# Patient Record
Sex: Female | Born: 1995 | Race: White | Hispanic: No | Marital: Single | Smoking: Current every day smoker
Health system: Southern US, Community
[De-identification: ages and names within clinical notes are randomized; demographics above are authoritative.]

## PROBLEM LIST (undated history)

## (undated) ENCOUNTER — Inpatient Hospital Stay: Payer: Self-pay

## (undated) DIAGNOSIS — I10 Essential (primary) hypertension: Secondary | ICD-10-CM

## (undated) DIAGNOSIS — Z789 Other specified health status: Secondary | ICD-10-CM

## (undated) DIAGNOSIS — G43909 Migraine, unspecified, not intractable, without status migrainosus: Secondary | ICD-10-CM

## (undated) DIAGNOSIS — F99 Mental disorder, not otherwise specified: Secondary | ICD-10-CM

## (undated) HISTORY — DX: Essential (primary) hypertension: I10

## (undated) HISTORY — PX: NO PAST SURGERIES: SHX2092

## (undated) HISTORY — PX: OTHER SURGICAL HISTORY: SHX169

## (undated) HISTORY — DX: Migraine, unspecified, not intractable, without status migrainosus: G43.909

## (undated) HISTORY — DX: Mental disorder, not otherwise specified: F99

---
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
## 2016-06-01 LAB — OB RESULTS CONSOLE GC/CHLAMYDIA
CHLAMYDIA, DNA PROBE: POSITIVE
GC PROBE AMP, GENITAL: NEGATIVE

## 2016-06-19 NOTE — L&D Delivery Note (Signed)
Delivery Note At 5:03 AM a viable female infant was delivered via Vaginal, Spontaneous Delivery (Presentation: OA).  APGAR: 8, 8; weight 7 lb 9.3 oz (3440 g).   Placenta status: delivered spontaneously, intact.  Cord: 3VC with the following complications: none.  Cord pH: n/a  Anesthesia: epidural  Episiotomy: None Lacerations: None Suture Repair: n/a Est. Blood Loss (mL): 700  Mom to postpartum.  Baby to Couplet care / Skin to Skin.  Called to see patient.  Mom pushed to deliver a viable female infant.  The head followed by shoulders, which delivered without difficulty, and the rest of the body.  No nuchal cord noted.  Baby to mom's chest.  Cord clamped and cut after > 1 min delay.  Cord blood obtained.  Placenta delivered spontaneously, intact, with a 3-vessel cord.  No vaginal, cervical, or perineal lacerations.  She had a postpartum hemorrhage that required pitocin IV, misoprostol 800 mcg PR, and Hemabate 250 mcg IM.  All counts correct.  Hemostasis obtained with IV pitocin and fundal massage. EBL 700 mL.     Dawn MohairStephen Leani Chambers 09/06/2016, 5:37 AM

## 2016-06-23 LAB — OB RESULTS CONSOLE ANTIBODY SCREEN: Antibody Screen: NEGATIVE

## 2016-06-23 LAB — OB RESULTS CONSOLE VARICELLA ZOSTER ANTIBODY, IGG: Varicella: IMMUNE

## 2016-06-23 LAB — OB RESULTS CONSOLE ABO/RH: RH TYPE: POSITIVE

## 2016-06-23 LAB — OB RESULTS CONSOLE PLATELET COUNT: Platelets: 173 10*3/uL

## 2016-06-23 LAB — OB RESULTS CONSOLE RPR: RPR: NONREACTIVE

## 2016-06-23 LAB — OB RESULTS CONSOLE HGB/HCT, BLOOD
HEMATOCRIT: 35 %
Hemoglobin: 11.6 g/dL

## 2016-06-23 LAB — OB RESULTS CONSOLE HEPATITIS B SURFACE ANTIGEN: HEP B S AG: NEGATIVE

## 2016-06-23 LAB — OB RESULTS CONSOLE RUBELLA ANTIBODY, IGM: RUBELLA: IMMUNE

## 2016-06-23 LAB — OB RESULTS CONSOLE GC/CHLAMYDIA
CHLAMYDIA, DNA PROBE: NEGATIVE
GC PROBE AMP, GENITAL: NEGATIVE

## 2016-06-23 LAB — OB RESULTS CONSOLE HIV ANTIBODY (ROUTINE TESTING): HIV: NONREACTIVE

## 2016-08-23 ENCOUNTER — Encounter: Payer: Self-pay | Admitting: Obstetrics and Gynecology

## 2016-08-23 LAB — OB RESULTS CONSOLE GBS: STREP GROUP B AG: NEGATIVE

## 2016-08-30 ENCOUNTER — Encounter: Payer: Medicaid Other | Admitting: Obstetrics & Gynecology

## 2016-09-05 ENCOUNTER — Inpatient Hospital Stay: Payer: Medicaid Other | Admitting: Anesthesiology

## 2016-09-05 ENCOUNTER — Inpatient Hospital Stay
Admission: EM | Admit: 2016-09-05 | Discharge: 2016-09-07 | DRG: 774 | Disposition: A | Discharge status: Home / Self Care | Payer: Medicaid Other | Attending: Obstetrics and Gynecology | Admitting: Obstetrics and Gynecology

## 2016-09-05 ENCOUNTER — Encounter: Payer: Self-pay | Admitting: *Deleted

## 2016-09-05 DIAGNOSIS — Z833 Family history of diabetes mellitus: Secondary | ICD-10-CM | POA: Diagnosis not present

## 2016-09-05 DIAGNOSIS — Z3A38 38 weeks gestation of pregnancy: Secondary | ICD-10-CM | POA: Diagnosis not present

## 2016-09-05 DIAGNOSIS — O134 Gestational [pregnancy-induced] hypertension without significant proteinuria, complicating childbirth: Secondary | ICD-10-CM | POA: Diagnosis present

## 2016-09-05 DIAGNOSIS — Z3493 Encounter for supervision of normal pregnancy, unspecified, third trimester: Secondary | ICD-10-CM | POA: Diagnosis present

## 2016-09-05 DIAGNOSIS — O9081 Anemia of the puerperium: Secondary | ICD-10-CM | POA: Diagnosis present

## 2016-09-05 DIAGNOSIS — O139 Gestational [pregnancy-induced] hypertension without significant proteinuria, unspecified trimester: Secondary | ICD-10-CM | POA: Diagnosis present

## 2016-09-05 DIAGNOSIS — F17210 Nicotine dependence, cigarettes, uncomplicated: Secondary | ICD-10-CM | POA: Diagnosis present

## 2016-09-05 DIAGNOSIS — O99334 Smoking (tobacco) complicating childbirth: Secondary | ICD-10-CM | POA: Diagnosis present

## 2016-09-05 HISTORY — DX: Other specified health status: Z78.9

## 2016-09-05 LAB — COMPREHENSIVE METABOLIC PANEL
ALBUMIN: 3.2 g/dL — AB (ref 3.5–5.0)
ALT: 23 U/L (ref 14–54)
AST: 30 U/L (ref 15–41)
Alkaline Phosphatase: 275 U/L — ABNORMAL HIGH (ref 38–126)
Anion gap: 7 (ref 5–15)
BILIRUBIN TOTAL: 0.4 mg/dL (ref 0.3–1.2)
BUN: 6 mg/dL (ref 6–20)
CHLORIDE: 104 mmol/L (ref 101–111)
CO2: 24 mmol/L (ref 22–32)
CREATININE: 0.54 mg/dL (ref 0.44–1.00)
Calcium: 8.5 mg/dL — ABNORMAL LOW (ref 8.9–10.3)
GFR calc Af Amer: >60 mL/min (ref >60)
Glucose, Bld: 108 mg/dL — ABNORMAL HIGH (ref 65–99)
POTASSIUM: 3.7 mmol/L (ref 3.5–5.1)
Sodium: 135 mmol/L (ref 135–145)
TOTAL PROTEIN: 6.7 g/dL (ref 6.5–8.1)

## 2016-09-05 LAB — PROTEIN / CREATININE RATIO, URINE
Creatinine, Urine: 104 mg/dL
Protein Creatinine Ratio: 0.18 mg/mg{Cre} — ABNORMAL HIGH (ref 0.00–0.15)
Total Protein, Urine: 19 mg/dL

## 2016-09-05 LAB — CBC
HCT: 35.4 % (ref 35.0–47.0)
HEMOGLOBIN: 12.3 g/dL (ref 12.0–16.0)
MCH: 30.7 pg (ref 26.0–34.0)
MCHC: 34.8 g/dL (ref 32.0–36.0)
MCV: 88.3 fL (ref 80.0–100.0)
Platelets: 182 10*3/uL (ref 150–440)
RBC: 4.01 MIL/uL (ref 3.80–5.20)
RDW: 12.3 % (ref 11.5–14.5)
WBC: 11.2 10*3/uL — AB (ref 3.6–11.0)

## 2016-09-05 LAB — CHLAMYDIA/NGC RT PCR (ARMC ONLY)
Chlamydia Tr: NOT DETECTED
N GONORRHOEAE: NOT DETECTED

## 2016-09-05 LAB — TYPE AND SCREEN
ABO/RH(D): O POS
ANTIBODY SCREEN: NEGATIVE

## 2016-09-05 LAB — GLUCOSE, 1 HOUR GESTATIONAL: GESTATIONAL DIABETES SCREEN: 83

## 2016-09-05 MED ORDER — FENTANYL 2.5 MCG/ML W/ROPIVACAINE 0.2% IN NS 100 ML EPIDURAL INFUSION (ARMC-ANES)
EPIDURAL | Status: AC
  Filled 2016-09-05: qty 100

## 2016-09-05 MED ORDER — ACETAMINOPHEN 500 MG PO TABS
1000.0000 mg | ORAL_TABLET | Freq: Once | ORAL | Status: AC
  Administered 2016-09-05: 1000 mg via ORAL
  Filled 2016-09-05: qty 2

## 2016-09-05 MED ORDER — DIPHENHYDRAMINE HCL 50 MG/ML IJ SOLN: 12.5000 mg | INTRAMUSCULAR | Status: DC | PRN

## 2016-09-05 MED ORDER — SOD CITRATE-CITRIC ACID 500-334 MG/5ML PO SOLN: 30.0000 mL | ORAL | Status: DC | PRN

## 2016-09-05 MED ORDER — AMMONIA AROMATIC IN INHA
RESPIRATORY_TRACT | Status: AC
  Filled 2016-09-05: qty 10

## 2016-09-05 MED ORDER — PHENYLEPHRINE 40 MCG/ML (10ML) SYRINGE FOR IV PUSH (FOR BLOOD PRESSURE SUPPORT): 80.0000 ug | PREFILLED_SYRINGE | INTRAVENOUS | Status: DC | PRN

## 2016-09-05 MED ORDER — LACTATED RINGERS IV SOLN: 500.0000 mL | INTRAVENOUS | Status: DC | PRN

## 2016-09-05 MED ORDER — ONDANSETRON HCL 4 MG/2ML IJ SOLN
4.0000 mg | Freq: Four times a day (QID) | INTRAMUSCULAR | Status: DC | PRN
  Administered 2016-09-06 (×2): 4 mg via INTRAVENOUS
  Filled 2016-09-05 (×2): qty 2

## 2016-09-05 MED ORDER — MISOPROSTOL 200 MCG PO TABS
ORAL_TABLET | ORAL | Status: AC
  Administered 2016-09-06: 800 ug
  Filled 2016-09-05: qty 4

## 2016-09-05 MED ORDER — LIDOCAINE-EPINEPHRINE (PF) 1.5 %-1:200000 IJ SOLN
INTRAMUSCULAR | Status: DC | PRN
  Administered 2016-09-05: 4 mL via EPIDURAL

## 2016-09-05 MED ORDER — OXYTOCIN 40 UNITS IN LACTATED RINGERS INFUSION - SIMPLE MED: 2.5000 [IU]/h | INTRAVENOUS | Status: DC

## 2016-09-05 MED ORDER — EPHEDRINE 5 MG/ML INJ
10.0000 mg | INTRAVENOUS | Status: DC | PRN
Start: 2016-09-05 — End: 2016-09-06

## 2016-09-05 MED ORDER — BUPIVACAINE HCL (PF) 0.25 % IJ SOLN
INTRAMUSCULAR | Status: DC | PRN
  Administered 2016-09-05 (×2): 4 mL via EPIDURAL

## 2016-09-05 MED ORDER — LIDOCAINE HCL (PF) 1 % IJ SOLN: 30.0000 mL | INTRAMUSCULAR | Status: DC | PRN

## 2016-09-05 MED ORDER — FENTANYL 2.5 MCG/ML W/ROPIVACAINE 0.2% IN NS 100 ML EPIDURAL INFUSION (ARMC-ANES)
EPIDURAL | Status: DC | PRN
  Administered 2016-09-05: 10 mL/h via EPIDURAL

## 2016-09-05 MED ORDER — LACTATED RINGERS IV SOLN
INTRAVENOUS | Status: DC
  Administered 2016-09-05: 20:00:00 via INTRAVENOUS

## 2016-09-05 MED ORDER — EPHEDRINE 5 MG/ML INJ: 10.0000 mg | INTRAVENOUS | Status: DC | PRN

## 2016-09-05 MED ORDER — LIDOCAINE HCL (PF) 1 % IJ SOLN
INTRAMUSCULAR | Status: DC | PRN
  Administered 2016-09-05: 3 mL

## 2016-09-05 MED ORDER — OXYTOCIN 10 UNIT/ML IJ SOLN: 10.0000 [IU] | Freq: Once | INTRAMUSCULAR | Status: DC

## 2016-09-05 MED ORDER — OXYTOCIN 40 UNITS IN LACTATED RINGERS INFUSION - SIMPLE MED
INTRAVENOUS | Status: AC
  Filled 2016-09-05: qty 1000

## 2016-09-05 MED ORDER — LIDOCAINE HCL (PF) 1 % IJ SOLN
INTRAMUSCULAR | Status: AC
  Filled 2016-09-05: qty 30

## 2016-09-05 MED ORDER — LACTATED RINGERS IV SOLN: 500.0000 mL | Freq: Once | INTRAVENOUS | Status: DC

## 2016-09-05 MED ORDER — FENTANYL 2.5 MCG/ML W/ROPIVACAINE 0.2% IN NS 100 ML EPIDURAL INFUSION (ARMC-ANES): 10.0000 mL/h | EPIDURAL | Status: DC

## 2016-09-05 MED ORDER — OXYTOCIN BOLUS FROM INFUSION
500.0000 mL | Freq: Once | INTRAVENOUS | Status: DC
  Administered 2016-09-06: 500 mL via INTRAVENOUS

## 2016-09-05 NOTE — H&P (Signed)
OB History & Physical   History of Present Illness:  Chief Complaint: contractions  HPI:  Dawn Chambers is a 21 y.o. 132P1001 female at 1475w5d dated by 13 week ultrasound.  Her pregnancy has been complicated by chlamydia trachomatis (TOC February neg)..    She reports contractions.   She denies leakage of fluid.   She denies vaginal bleeding.   She reports fetal movement.  She denies HA, visual changes, and RUQ pain.  Maternal Medical History:   Past Medical History:  Diagnosis Date  . Medical history non-contributory     Past Surgical History:  Procedure Laterality Date  . denies sx history    . NO PAST SURGERIES     Allergies: No Known Allergies  Prior to Admission medications   Medication Sig Start Date End Date Taking? Authorizing Provider  Prenatal Vit-Fe Fumarate-FA (MULTIVITAMIN-PRENATAL) 27-0.8 MG TABS tablet Take 1 tablet by mouth daily at 12 noon.    Historical Provider, MD    OB History  Gravida Para Term Preterm AB Living  2 1 1     1   SAB TAB Ectopic Multiple Live Births        0 1    # Outcome Date GA Lbr Len/2nd Weight Sex Delivery Anes PTL Lv  2 Current           1 Term 07/23/15 1248w2d 12:28 / 02:18 6 lb 11 oz (3.033 kg) F Vag-Spont EPI  LIV      Prenatal care site: [facility]  Social History: She  reports that she has been smoking.  She has been smoking about 0.50 packs per day. She has never used smokeless tobacco. She reports that she does not drink alcohol or use drugs.  Family History: family history includes Diabetes in her maternal grandfather; Pancreatic cancer in her maternal grandmother.   Review of Systems: Negative x 10 systems reviewed except as noted in the HPI.    Physical Exam:  Vital Signs: BP (!) 119/46 (BP Location: Left Arm)   Pulse 74   Temp 98.1 F (36.7 C) (Oral)   Resp 18   Ht 5\' 2"  (1.575 m)   Wt 156 lb (70.8 kg)   LMP 12/18/2015   SpO2 100%   BMI 28.53 kg/m  Physical Exam  Constitutional: She is oriented to  person, place, and time and well-developed, well-nourished, and in no distress. No distress.  HENT:  Head: Normocephalic and atraumatic.  Eyes: Conjunctivae are normal. Left eye exhibits no discharge. No scleral icterus.  Neck: Normal range of motion. Neck supple. No thyromegaly present.  Cardiovascular: Normal rate and regular rhythm.  Exam reveals no gallop and no friction rub.   No murmur heard. Pulmonary/Chest: Effort normal and breath sounds normal. No respiratory distress. She has no wheezes. She has no rales.  Abdominal:  Gravid, NT (EFW 7 pounds)  Genitourinary: Vulva normal.  Genitourinary Comments: Per RN 5cm (change from 4cm)  Musculoskeletal: Normal range of motion. She exhibits no edema.  Lymphadenopathy:    She has no cervical adenopathy.  Neurological: She is alert and oriented to person, place, and time. She displays normal reflexes. No cranial nerve deficit.  Skin: Skin is warm and dry. No rash noted.  Psychiatric: Mood, affect and judgment normal.     Pertinent Results:  Prenatal Labs: Blood type/Rh O positive  Antibody screen negative  Rubella Immune  Varicella Immune    RPR NR  HBsAg negative  HIV negative  GC negative  Chlamydia Positive (  TOC negative in February)  Genetic screening Quad screen negative  1 hour GTT 83  3 hour GTT n/a  GBS negative on 08/23/16   Baseline FHR: 120 beats/min   Variability: moderate   Accelerations: present   Decelerations: absent Contractions: present frequency: 2-3 q 10 min Overall assessment: cat 1  Lab Results  Component Value Date   WBC 11.2 (H) 09/05/2016   HGB 12.3 09/05/2016   HCT 35.4 09/05/2016   PLT 182 09/05/2016   CREATININE 0.54 09/05/2016   PROTCRRATIO 0.18 (H) 09/05/2016   Assessment:  Dawn Chambers is a 21 y.o. G18P1001 female at [redacted]w[redacted]d with active labor, gestational hypertension.   Plan:  1. Admit to Labor & Delivery  2. CBC, T&S, Clrs, IVF 3. GBS negative 08/23/16.   4. Fetwal well-being:  reassurign 5. 22 pound weight gain this pregnancy. 6. Expectant management, will augment, if does not progress in labor   Thomasene Mohair, MD 09/05/2016 8:32 PM

## 2016-09-05 NOTE — Anesthesia Procedure Notes (Signed)
Epidural Patient location during procedure: OB  Staffing Performed: anesthesiologist   Preanesthetic Checklist Completed: patient identified, site marked, surgical consent, pre-op evaluation, timeout performed, IV checked, risks and benefits discussed and monitors and equipment checked  Epidural Patient position: sitting Prep: Betadine Patient monitoring: heart rate, continuous pulse ox and blood pressure Approach: midline Location: L4-L5 Injection technique: LOR saline  Needle:  Needle type: Tuohy  Needle gauge: 18 G Needle length: 9 cm and 9 Needle insertion depth: 5 cm Catheter type: closed end flexible Catheter size: 20 Guage Catheter at skin depth: 11 cm Test dose: negative and 1.5% lidocaine with Epi 1:200 K  Assessment Sensory level: T10 Events: blood not aspirated, injection not painful, no injection resistance, negative IV test and no paresthesia  Additional Notes   Patient tolerated the insertion well without complications.-SATD -IVTD. No paresthesia. Refer to [facility] nursing for VS and dosingReason for block:procedure for pain     

## 2016-09-05 NOTE — OB Triage Note (Signed)
G2P1 presents at 2660w5d w c/o ctx since 0530. Feels like pressure in her lower back. No LOF, no VB. +FM.

## 2016-09-05 NOTE — Anesthesia Preprocedure Evaluation (Signed)
Anesthesia Evaluation  Patient identified by MRN, date of birth, ID band Patient awake    Reviewed: Allergy & Precautions, H&P , NPO status , Patient's Chart, lab work & pertinent test results, reviewed documented beta blocker date and time   Airway Mallampati: II  TM Distance: >3 FB Neck ROM: full    Dental no notable dental hx. (+) Teeth Intact   Pulmonary neg pulmonary ROS, Current Smoker,    Pulmonary exam normal breath sounds clear to auscultation       Cardiovascular Exercise Tolerance: Good hypertension, negative cardio ROS   Rhythm:regular Rate:Normal     Neuro/Psych negative neurological ROS  negative psych ROS   GI/Hepatic negative GI ROS, Neg liver ROS,   Endo/Other  negative endocrine ROSdiabetes  Renal/GU      Musculoskeletal   Abdominal   Peds  Hematology negative hematology ROS (+)   Anesthesia Other Findings   Reproductive/Obstetrics negative OB ROS (+) Pregnancy                             Anesthesia Physical Anesthesia Plan  ASA: II  Anesthesia Plan: Epidural   Post-op Pain Management:    Induction:   Airway Management Planned:   Additional Equipment:   Intra-op Plan:   Post-operative Plan:   Informed Consent: I have reviewed the patients History and Physical, chart, labs and discussed the procedure including the risks, benefits and alternatives for the proposed anesthesia with the patient or authorized representative who has indicated his/her understanding and acceptance.     Plan Discussed with:   Anesthesia Plan Comments:         Anesthesia Quick Evaluation

## 2016-09-06 ENCOUNTER — Encounter: Payer: Medicaid Other | Admitting: Advanced Practice Midwife

## 2016-09-06 ENCOUNTER — Encounter: Payer: Self-pay | Admitting: *Deleted

## 2016-09-06 DIAGNOSIS — Z3A38 38 weeks gestation of pregnancy: Secondary | ICD-10-CM

## 2016-09-06 DIAGNOSIS — O134 Gestational [pregnancy-induced] hypertension without significant proteinuria, complicating childbirth: Secondary | ICD-10-CM

## 2016-09-06 DIAGNOSIS — R58 Hemorrhage, not elsewhere classified: Secondary | ICD-10-CM

## 2016-09-06 MED ORDER — IBUPROFEN 600 MG PO TABS
600.0000 mg | ORAL_TABLET | Freq: Four times a day (QID) | ORAL | Status: DC
  Administered 2016-09-06 – 2016-09-07 (×4): 600 mg via ORAL
  Filled 2016-09-06 (×5): qty 1

## 2016-09-06 MED ORDER — BENZOCAINE-MENTHOL 20-0.5 % EX AERO: 1.0000 "application " | INHALATION_SPRAY | CUTANEOUS | Status: DC | PRN

## 2016-09-06 MED ORDER — COCONUT OIL OIL: 1.0000 "application " | TOPICAL_OIL | Status: DC | PRN

## 2016-09-06 MED ORDER — HYDROCODONE-ACETAMINOPHEN 5-325 MG PO TABS
1.0000 | ORAL_TABLET | Freq: Four times a day (QID) | ORAL | Status: DC | PRN
  Administered 2016-09-06 – 2016-09-07 (×5): 1 via ORAL
  Filled 2016-09-06 (×5): qty 1

## 2016-09-06 MED ORDER — DIBUCAINE 1 % RE OINT: 1.0000 "application " | TOPICAL_OINTMENT | RECTAL | Status: DC | PRN

## 2016-09-06 MED ORDER — TERBUTALINE SULFATE 1 MG/ML IJ SOLN: 0.2500 mg | Freq: Once | INTRAMUSCULAR | Status: DC | PRN

## 2016-09-06 MED ORDER — KETOROLAC TROMETHAMINE 15 MG/ML IJ SOLN
INTRAMUSCULAR | Status: AC
  Administered 2016-09-06: 15 mg via INTRAVENOUS
  Filled 2016-09-06: qty 1

## 2016-09-06 MED ORDER — WITCH HAZEL-GLYCERIN EX PADS: 1.0000 "application " | MEDICATED_PAD | CUTANEOUS | Status: DC | PRN

## 2016-09-06 MED ORDER — CARBOPROST TROMETHAMINE 250 MCG/ML IM SOLN
INTRAMUSCULAR | Status: AC
  Administered 2016-09-06: 250 ug
  Filled 2016-09-06: qty 1

## 2016-09-06 MED ORDER — PRENATAL MULTIVITAMIN CH
1.0000 | ORAL_TABLET | Freq: Every day | ORAL | Status: DC
  Administered 2016-09-06 – 2016-09-07 (×2): 1 via ORAL
  Filled 2016-09-06 (×3): qty 1

## 2016-09-06 MED ORDER — LOPERAMIDE HCL 2 MG PO CAPS
ORAL_CAPSULE | ORAL | Status: AC
  Administered 2016-09-06: 2 mg via ORAL
  Filled 2016-09-06: qty 1

## 2016-09-06 MED ORDER — KETOROLAC TROMETHAMINE 15 MG/ML IJ SOLN
15.0000 mg | Freq: Once | INTRAMUSCULAR | Status: AC
  Administered 2016-09-06: 15 mg via INTRAVENOUS

## 2016-09-06 MED ORDER — OXYTOCIN 40 UNITS IN LACTATED RINGERS INFUSION - SIMPLE MED
INTRAVENOUS | Status: AC
  Administered 2016-09-06: 500 mL via INTRAVENOUS
  Filled 2016-09-06: qty 1000

## 2016-09-06 MED ORDER — OXYTOCIN 40 UNITS IN LACTATED RINGERS INFUSION - SIMPLE MED: 1.0000 m[IU]/min | INTRAVENOUS | Status: DC

## 2016-09-06 MED ORDER — DIPHENHYDRAMINE HCL 25 MG PO CAPS: 25.0000 mg | ORAL_CAPSULE | Freq: Four times a day (QID) | ORAL | Status: DC | PRN

## 2016-09-06 MED ORDER — SENNOSIDES-DOCUSATE SODIUM 8.6-50 MG PO TABS
2.0000 | ORAL_TABLET | ORAL | Status: DC
  Filled 2016-09-06: qty 2

## 2016-09-06 MED ORDER — ONDANSETRON HCL 4 MG/2ML IJ SOLN: 4.0000 mg | INTRAMUSCULAR | Status: DC | PRN

## 2016-09-06 MED ORDER — SIMETHICONE 80 MG PO CHEW: 80.0000 mg | CHEWABLE_TABLET | ORAL | Status: DC | PRN

## 2016-09-06 MED ORDER — ACETAMINOPHEN 325 MG PO TABS: 650.0000 mg | ORAL_TABLET | ORAL | Status: DC | PRN

## 2016-09-06 MED ORDER — ONDANSETRON HCL 4 MG PO TABS: 4.0000 mg | ORAL_TABLET | ORAL | Status: DC | PRN

## 2016-09-06 MED ORDER — FERROUS SULFATE 325 (65 FE) MG PO TABS
325.0000 mg | ORAL_TABLET | Freq: Two times a day (BID) | ORAL | Status: DC
  Administered 2016-09-06 – 2016-09-07 (×3): 325 mg via ORAL
  Filled 2016-09-06 (×3): qty 1

## 2016-09-06 MED ORDER — LOPERAMIDE HCL 2 MG PO CAPS
2.0000 mg | ORAL_CAPSULE | Freq: Once | ORAL | Status: AC
  Administered 2016-09-06: 2 mg via ORAL

## 2016-09-06 NOTE — Discharge Summary (Signed)
OB Discharge Summary     Patient Name: Dawn Chambers DOB: 05/19/1996 MRN: [id number]  Date of admission: 09/05/2016 Delivering MD: Thomasene MohairStephen Jackson, MD  Date of Delivery: 09/06/2016  Date of discharge: 09/07/2016  Admitting diagnosis: 40 weeks preg contractions Intrauterine pregnancy: 3234w6d     Secondary diagnosis: Gestational Hypertension     Discharge diagnosis: Term Pregnancy Delivered and Gestational Hypertension                                                                                                Post partum procedures:none  Augmentation: AROM and Pitocin  Complications: None  Hospital course:  Onset of Labor With Vaginal Delivery     21 y.o. yo G2P1001 at 5734w6d was admitted in Active Labor on 09/05/2016. Patient had an uncomplicated labor course as follows:  Membrane Rupture Time/Date: 1:16 AM ,09/06/2016   Intrapartum Procedures: Episiotomy: None [1]                                         Lacerations:  None [1]  Patient had a delivery of a Viable infant. 09/06/2016  Information for the patient's newborn:  Dawn Chambers, Boy Dawn Chambers [[id number]][[id number]]  Delivery Method: Vag-Spont    Pateint had an uncomplicated postpartum course.  She is ambulating, tolerating a regular diet, passing flatus, and urinating well. Patient is discharged home in stable condition on 09/07/16.   Physical exam  Vitals:   09/06/16 1858 09/07/16 0007 09/07/16 0430 09/07/16 0730  BP: 123/68 127/80 115/64 115/72  Pulse: 61 78 69 62  Resp: 20 20 20 18   Temp: 97.9 F (36.6 C) 97.9 F (36.6 C) 97.5 F (36.4 C) 97.8 F (36.6 C)  TempSrc: Oral Oral Oral Oral  SpO2: 98%  100% 100%  Weight:      Height:       General: alert, cooperative and no distress Lochia: appropriate Uterine Fundus: firm Incision: N/A DVT Evaluation: No evidence of DVT seen on physical exam. No cords or calf tenderness. No significant calf/ankle edema.  Labs: Lab Results  Component Value Date   WBC 11.3 (H) 09/07/2016   HGB  9.5 (L) 09/07/2016   HCT 26.8 (L) 09/07/2016   MCV 87.6 09/07/2016   PLT 168 09/07/2016    Discharge instruction: per After Visit Summary.  Medications:  Allergies as of 09/07/2016   No Known Allergies     Medication List    STOP taking these medications   DICLEGIS PO     TAKE these medications   multivitamin-prenatal 27-0.8 MG Tabs tablet Take 1 tablet by mouth daily at 12 noon.       Diet: routine diet  Activity: Advance as tolerated. Pelvic rest for 6 weeks.   Outpatient follow up: Follow-up Information    Thomasene MohairStephen Jackson, MD Follow up in 6 week(s).   Specialty:  Obstetrics and Gynecology Why:  postpartum checkup Contact information: [address] [place] [id number]             Postpartum contraception:  Nexplanon Rhogam Given postpartum: no Rubella vaccine given postpartum: no Varicella vaccine given postpartum: no TDaP given antepartum or postpartum: given at [facility]  Newborn Data: Live born female  Birth Weight: 7 lb 9.3 oz (3440 g) APGAR: 8, 8   Baby Feeding: formula  Disposition:home with mother  SIGNED: Tresea Mall, CNM

## 2016-09-06 NOTE — Progress Notes (Signed)
Subjective:  Doing well moderate lochia, no fevers no chills  Objective:   Blood pressure 120/71, pulse 60, temperature 98.2 F (36.8 C), temperature source Oral, resp. rate 18, height 5' 2" (1.575 m), weight 156 lb (70.8 kg), last menstrual period 12/18/2015, SpO2 98 %, unknown if currently breastfeeding.  General: NAD Pulmonary: no increased work of breathing Abdomen: non-distended, non-tender, fundus firm at level of umbilicus Extremities: no edema, no erythema, no tenderness  Results for orders placed or performed during the hospital encounter of 09/05/16 (from the past 72 hour(s))  CBC     Status: Abnormal   Collection Time: 09/05/16  6:33 PM  Result Value Ref Range   WBC 11.2 (H) 3.6 - 11.0 K/uL   RBC 4.01 3.80 - 5.20 MIL/uL   Hemoglobin 12.3 12.0 - 16.0 g/dL   HCT 35.4 35.0 - 47.0 %   MCV 88.3 80.0 - 100.0 fL   MCH 30.7 26.0 - 34.0 pg   MCHC 34.8 32.0 - 36.0 g/dL   RDW 12.3 11.5 - 14.5 %   Platelets 182 150 - 440 K/uL  Comprehensive metabolic panel     Status: Abnormal   Collection Time: 09/05/16  6:33 PM  Result Value Ref Range   Sodium 135 135 - 145 mmol/L   Potassium 3.7 3.5 - 5.1 mmol/L   Chloride 104 101 - 111 mmol/L   CO2 24 22 - 32 mmol/L   Glucose, Bld 108 (H) 65 - 99 mg/dL   BUN 6 6 - 20 mg/dL   Creatinine, Ser 0.54 0.44 - 1.00 mg/dL   Calcium 8.5 (L) 8.9 - 10.3 mg/dL   Total Protein 6.7 6.5 - 8.1 g/dL   Albumin 3.2 (L) 3.5 - 5.0 g/dL   AST 30 15 - 41 U/L   ALT 23 14 - 54 U/L   Alkaline Phosphatase 275 (H) 38 - 126 U/L   Total Bilirubin 0.4 0.3 - 1.2 mg/dL   GFR calc non Af Amer >60 >60 mL/min   GFR calc Af Amer >60 >60 mL/min    Comment: (NOTE) The eGFR has been calculated using the CKD EPI equation. This calculation has not been validated in all clinical situations. eGFR's persistently <60 mL/min signify possible Chronic Kidney Disease.    Anion gap 7 5 - 15  Type and screen [facility]     Status: None   Collection Time:  09/05/16  6:33 PM  Result Value Ref Range   ABO/RH(D) O POS    Antibody Screen NEG    Sample Expiration 09/08/2016   Protein / creatinine ratio, urine     Status: Abnormal   Collection Time: 09/05/16  6:58 PM  Result Value Ref Range   Creatinine, Urine 104 mg/dL   Total Protein, Urine 19 mg/dL    Comment: NO NORMAL RANGE ESTABLISHED FOR THIS TEST   Protein Creatinine Ratio 0.18 (H) 0.00 - 0.15 mg/mg[Cre]  Chlamydia/NGC rt PCR ([facility] only)     Status: None   Collection Time: 09/05/16  6:58 PM  Result Value Ref Range   Specimen source GC/Chlam URINE, RANDOM    Chlamydia Tr NOT DETECTED NOT DETECTED   N gonorrhoeae NOT DETECTED NOT DETECTED    Comment: (NOTE) 100  This methodology has not been evaluated in pregnant women or in 200  patients with a history of hysterectomy. 300 400  This methodology will not be performed on patients less than 5  years of age.     Assessment:  21 y.o. G2P1001 postpartum day #0 TSVD  Plan:    1) Acute blood loss anemia - hemodynamically stable and asymptomatic - po ferrous sulfate - CBC in AM  2) Blood Type --/--/O POS (03/20 1833) / Rubella Immune (01/05 0000) / Varicella Immune  4) Bottle feeding  5) Disposition interested in 24-hr discharge tomorrow

## 2016-09-06 NOTE — Progress Notes (Signed)
Patient ID: Dawn Chambers, female   DOB: 07/01/1995, 21 y.o.   MRN: [id number]  Labor Check  Subj:  Complaints: comfortable with epidural   Obj:  BP 136/86   Pulse 65   Temp 97.5 F (36.4 C) (Oral)   Resp 18   Ht 5\' 2"  (1.575 m)   Wt 156 lb (70.8 kg)   LMP 12/18/2015   SpO2 99%   BMI 28.53 kg/m     Cervix: Dilation: 6 / Effacement (%): 80 / Station: -2   AROM: clear fluid Baseline FHR: 110 beats/min   Variability: moderate   Accelerations: present   Decelerations: absent Contractions: present frequency: 2-3 q 10 min Overall assessment: category 2 (at times baseline is ~105bpm)  A/P: 21 y.o. G2P1001 female at 10839w6d with gestational hypertension, labor.  1.  Labor: AROM, will recheck in 1 hour. If no change, will start pitocin  2.  FWB: reassuring, Overall assessment: category 2.  Periods of baseline at about 105. Excellent response to scalp stimulation to 165 bpm.  No identifiable cause for at-times low baseline HR.  Intrauterine resuscitation measures taken, but have not changed the baseline.  Continue to monitor closely and effect delivery actively.  3.  GBS negative  4.  Pain: epidural 5.  Recheck: prn   Thomasene MohairStephen Afra Tricarico, MD 09/06/2016 1:20 AM

## 2016-09-07 LAB — CBC
HEMATOCRIT: 26.8 % — AB (ref 35.0–47.0)
HEMOGLOBIN: 9.5 g/dL — AB (ref 12.0–16.0)
MCH: 30.9 pg (ref 26.0–34.0)
MCHC: 35.3 g/dL (ref 32.0–36.0)
MCV: 87.6 fL (ref 80.0–100.0)
Platelets: 168 10*3/uL (ref 150–440)
RBC: 3.06 MIL/uL — ABNORMAL LOW (ref 3.80–5.20)
RDW: 12.4 % (ref 11.5–14.5)
WBC: 11.3 10*3/uL — AB (ref 3.6–11.0)

## 2016-09-07 NOTE — Progress Notes (Signed)
Mother and Father both viewed the video, "The Period of Purple Cry".  CD copy given for discharge.

## 2016-09-07 NOTE — Anesthesia Postprocedure Evaluation (Signed)
Anesthesia Post Note  Patient: Eloy Endicole D Lehew  Procedure(s) Performed: * No procedures listed *  Patient location during evaluation: Mother Baby Anesthesia Type: Epidural Level of consciousness: awake, awake and alert and oriented Pain management: pain level controlled Vital Signs Assessment: post-procedure vital signs reviewed and stable Respiratory status: spontaneous breathing Cardiovascular status: blood pressure returned to baseline Postop Assessment: no headache, no backache, no signs of nausea or vomiting and adequate PO intake Anesthetic complications: no     Last Vitals:  Vitals:   09/07/16 0007 09/07/16 0430  BP: 127/80 115/64  Pulse: 78 69  Resp: 20 20  Temp: 36.6 C 36.4 C    Last Pain:  Vitals:   09/07/16 0430  TempSrc: Oral  PainSc:                  Karoline Caldwelleana Anesia Blackwell

## 2016-09-07 NOTE — Discharge Instructions (Signed)

## 2016-11-03 ENCOUNTER — Ambulatory Visit (INDEPENDENT_AMBULATORY_CARE_PROVIDER_SITE_OTHER): Payer: Medicaid Other | Admitting: Obstetrics and Gynecology

## 2016-11-03 DIAGNOSIS — Z3049 Encounter for surveillance of other contraceptives: Secondary | ICD-10-CM

## 2016-11-03 DIAGNOSIS — Z30017 Encounter for initial prescription of implantable subdermal contraceptive: Secondary | ICD-10-CM

## 2016-11-03 NOTE — Progress Notes (Signed)
Postpartum Visit  Chief Complaint: 6 week post partum visit  History of Present Illness: Patient is a 21 y.o. G2P1001 presents for postpartum visit.  Date of delivery: 09/06/2016 Type of delivery: Vaginal delivery - Vacuum or forceps assisted no Episiotomy No.  Laceration: no Pregnancy or labor problems:  no Any problems since the delivery: no  Newborn Details:  SINGLETON :  1. Baby's name: Pamelia Hoit. Birth weight: 7.12lb Maternal Details:  Breast Feeding:  No/bottle Post partum depression/anxiety noted:  no Edinburgh Post-Partum Depression Score:  3  Date of last PAP: pt has not had pap smear   Review of Systems: Review of Systems  Constitutional: Negative.   HENT: Negative.   Eyes: Negative.   Respiratory: Negative.   Cardiovascular: Negative.   Gastrointestinal: Negative.   Genitourinary: Negative.   Musculoskeletal: Negative.   Skin: Negative.   Neurological: Negative.   Psychiatric/Behavioral: Negative.     Past Medical History:  Diagnosis Date  . Medical history non-contributory     Past Surgical History:  Procedure Laterality Date  . denies sx history    . NO PAST SURGERIES      Family History  Problem Relation Age of Onset  . Pancreatic cancer Maternal Grandmother   . Diabetes Maternal Grandfather     Social History   Social History  . Marital status: Single    Spouse name: N/A  . Number of children: 1  . Years of education: 54   Occupational History  . Not on file.   Social History Main Topics  . Smoking status: Current Every Day Smoker    Packs/day: 0.50  . Smokeless tobacco: Never Used     Comment: 1/2 ppd  . Alcohol use No  . Drug use: No  . Sexual activity: Yes    Birth control/ protection: None   Other Topics Concern  . Not on file   Social History Narrative  . No narrative on file    Allergies: No Known Allergies  Medications:   Medication Sig Start Date End Date Taking? Authorizing Provider  Prenatal Vit-Fe Fumarate-FA  (MULTIVITAMIN-PRENATAL) 27-0.8 MG TABS tablet Take 1 tablet by mouth daily at 12 noon.    [provider]    Physical Exam BP 110/72   Ht 5\' 2"  (1.575 m)   Wt 134 lb (60.8 kg)   BMI 24.51 kg/m   Physical Exam  Constitutional: She is oriented to person, place, and time. She appears well-developed and well-nourished. No distress.  Eyes: EOM are normal. No scleral icterus.  Neck: Normal range of motion. Neck supple.  Cardiovascular: Normal rate and regular rhythm.   Pulmonary/Chest: Effort normal and breath sounds normal. No respiratory distress. She has no wheezes. She has no rales.  Abdominal: Soft. Bowel sounds are normal. She exhibits no distension and no mass. There is no tenderness. There is no rebound and no guarding.  Musculoskeletal: Normal range of motion. She exhibits no edema.  Neurological: She is alert and oriented to person, place, and time. No cranial nerve deficit.  Skin: Skin is warm and dry. No erythema.  Psychiatric: She has a normal mood and affect. Her behavior is normal. Judgment normal.    Female Chaperone present during breast and/or pelvic exam.  GYNECOLOGY PROCEDURE NOTE  Patient is a 21 y.o. G2P1001 presenting for Nexplanon insertion as her desires means of contraception.  She provided informed consent, signed copy in the chart, time out was performed. Pregnancy test was negative, with self reported LMP of today  She understands that Nexplanon is a progesterone only therapy, and that patients often patients have irregular and unpredictable vaginal bleeding or amenorrhea. She understands that other side effects are possible related to systemic progesterone, including but not limited to, headaches, breast tenderness, nausea, and irritability. While effective at preventing pregnancy long acting reversible contraceptives do not prevent transmission of sexually transmitted diseases and use of barrier methods for this purpose was discussed. The placement  procedure for Nexplanon was reviewed with the patient in detail including risks of nerve injury, infection, bleeding and injury to other muscles or tendons. She understands that the Nexplanon implant is good for 3 years and needs to be removed at the end of that time.  She understands that Nexplanon is an extremely effective option for contraception, with failure rate of <1%. This information is reviewed today and all questions were answered. Informed consent was obtained, both verbally and written.   The patient is healthy and has no contraindications to Implanon use. Urine pregnancy test was performed today and was negative.  Procedure Appropriate time out taken.  Patient placed in dorsal supine with left arm above head, elbow flexed at 90 degrees, arm resting on examination table.  The bicipital grove was palpated and site 8-10cm proximal to the medial epicondyle was indentified . The insertion site was prepped with a two betadine swabs and then injected with 3 cc of 2% lidocaine without epinephrine.  Nexplanon removed form sterile blister packaging,  Device confirmed in needle, before inserting full length of needle, tenting up the skin as the needle was advance.  The drug eluting rod was then deployed by pulling back the slider per the manufactures recommendation.  The implant was palpable by the clinician as well as the patient.  The insertion site covered dressed with a band aid before applying  a kerlex bandage pressure dressing..Minimal blood loss was noted during the procedure.  The patientt tolerated the procedure well.   She was instructed to wear the bandage for 24 hours, call with any signs of infection.  She was given the Implanon card and instructed to have the rod removed in 3 years.  Assessment: 21 y.o. G2P1001 presenting for 6 week postpartum visit  Plan: Problem List Items Addressed This Visit    None    Visit Diagnoses    Postpartum care and examination    -  Primary        1) Contraception: Reviewed all forms of birth control options available including abstinence; over the counter/barrier methods; hormonal contraceptive medication including pill, patch, ring, injection,contraceptive implant; hormonal and nonhormonal IUDs; permanent sterilization options including vasectomy and the various tubal sterilization modalities. Risks and benefits reviewed.  Questions were answered.  Information was given to patient to review.  Patient would like to get a Nexplanon.  Placed today.  2)  Pap - ASCCP guidelines and rational discussed.  Return in one year for pap smear  3) Patient underwent screening for postpartum depression with no concerns noted.  4) Follow up 1 year for routine annual exam

## 2017-03-18 ENCOUNTER — Observation Stay
Admission: EM | Admit: 2017-03-18 | Discharge: 2017-03-20 | Disposition: A | Discharge status: Home / Self Care | Payer: Medicaid Other | Attending: Internal Medicine | Admitting: Internal Medicine

## 2017-03-18 ENCOUNTER — Encounter: Payer: Self-pay | Admitting: Emergency Medicine

## 2017-03-18 DIAGNOSIS — E860 Dehydration: Principal | ICD-10-CM | POA: Diagnosis present

## 2017-03-18 DIAGNOSIS — F17210 Nicotine dependence, cigarettes, uncomplicated: Secondary | ICD-10-CM | POA: Insufficient documentation

## 2017-03-18 DIAGNOSIS — A0811 Acute gastroenteropathy due to Norwalk agent: Secondary | ICD-10-CM | POA: Diagnosis present

## 2017-03-18 DIAGNOSIS — A09 Infectious gastroenteritis and colitis, unspecified: Secondary | ICD-10-CM | POA: Diagnosis present

## 2017-03-18 DIAGNOSIS — E876 Hypokalemia: Secondary | ICD-10-CM | POA: Diagnosis not present

## 2017-03-18 DIAGNOSIS — I951 Orthostatic hypotension: Secondary | ICD-10-CM | POA: Insufficient documentation

## 2017-03-18 DIAGNOSIS — R197 Diarrhea, unspecified: Secondary | ICD-10-CM | POA: Diagnosis present

## 2017-03-18 DIAGNOSIS — R112 Nausea with vomiting, unspecified: Secondary | ICD-10-CM | POA: Diagnosis present

## 2017-03-18 DIAGNOSIS — Z23 Encounter for immunization: Secondary | ICD-10-CM | POA: Insufficient documentation

## 2017-03-18 LAB — URINALYSIS, COMPLETE (UACMP) WITH MICROSCOPIC
Bacteria, UA: NONE SEEN
Bilirubin Urine: NEGATIVE
Glucose, UA: NEGATIVE mg/dL
Hgb urine dipstick: NEGATIVE
KETONES UR: 20 mg/dL — AB
Leukocytes, UA: NEGATIVE
Nitrite: NEGATIVE
PH: 5 (ref 5.0–8.0)
Protein, ur: NEGATIVE mg/dL
SPECIFIC GRAVITY, URINE: 1.017 (ref 1.005–1.030)

## 2017-03-18 LAB — GASTROINTESTINAL PANEL BY PCR, STOOL (REPLACES STOOL CULTURE)
ASTROVIRUS: NOT DETECTED
Adenovirus F40/41: NOT DETECTED
CAMPYLOBACTER SPECIES: NOT DETECTED
CYCLOSPORA CAYETANENSIS: NOT DETECTED
Cryptosporidium: NOT DETECTED
ENTEROTOXIGENIC E COLI (ETEC): NOT DETECTED
Entamoeba histolytica: NOT DETECTED
Enteroaggregative E coli (EAEC): NOT DETECTED
Enteropathogenic E coli (EPEC): NOT DETECTED
Giardia lamblia: NOT DETECTED
Norovirus GI/GII: DETECTED — AB
PLESIMONAS SHIGELLOIDES: NOT DETECTED
ROTAVIRUS A: NOT DETECTED
SAPOVIRUS (I, II, IV, AND V): NOT DETECTED
SHIGA LIKE TOXIN PRODUCING E COLI (STEC): NOT DETECTED
Salmonella species: NOT DETECTED
Shigella/Enteroinvasive E coli (EIEC): NOT DETECTED
VIBRIO SPECIES: NOT DETECTED
Vibrio cholerae: NOT DETECTED
Yersinia enterocolitica: NOT DETECTED

## 2017-03-18 LAB — CBC WITH DIFFERENTIAL/PLATELET
BASOS ABS: 0 10*3/uL (ref 0–0.1)
BASOS PCT: 0 %
EOS ABS: 0.4 10*3/uL (ref 0–0.7)
EOS PCT: 3 %
HCT: 41.5 % (ref 35.0–47.0)
Hemoglobin: 14.7 g/dL (ref 12.0–16.0)
LYMPHS PCT: 8 %
Lymphs Abs: 1.1 10*3/uL (ref 1.0–3.6)
MCH: 30.7 pg (ref 26.0–34.0)
MCHC: 35.4 g/dL (ref 32.0–36.0)
MCV: 86.5 fL (ref 80.0–100.0)
Monocytes Absolute: 0.5 10*3/uL (ref 0.2–0.9)
Monocytes Relative: 4 %
Neutro Abs: 11.1 10*3/uL — ABNORMAL HIGH (ref 1.4–6.5)
Neutrophils Relative %: 85 %
PLATELETS: 197 10*3/uL (ref 150–440)
RBC: 4.8 MIL/uL (ref 3.80–5.20)
RDW: 13.1 % (ref 11.5–14.5)
WBC: 13 10*3/uL — AB (ref 3.6–11.0)

## 2017-03-18 LAB — COMPREHENSIVE METABOLIC PANEL
ALBUMIN: 4.6 g/dL (ref 3.5–5.0)
ALT: 13 U/L — ABNORMAL LOW (ref 14–54)
ANION GAP: 9 (ref 5–15)
AST: 18 U/L (ref 15–41)
Alkaline Phosphatase: 86 U/L (ref 38–126)
BILIRUBIN TOTAL: 1 mg/dL (ref 0.3–1.2)
BUN: 12 mg/dL (ref 6–20)
CO2: 21 mmol/L — ABNORMAL LOW (ref 22–32)
Calcium: 9.3 mg/dL (ref 8.9–10.3)
Chloride: 109 mmol/L (ref 101–111)
Creatinine, Ser: 0.76 mg/dL (ref 0.44–1.00)
GFR calc Af Amer: >60 mL/min (ref >60)
GFR calc non Af Amer: >60 mL/min (ref >60)
Glucose, Bld: 95 mg/dL (ref 65–99)
POTASSIUM: 3.8 mmol/L (ref 3.5–5.1)
SODIUM: 139 mmol/L (ref 135–145)
TOTAL PROTEIN: 7.9 g/dL (ref 6.5–8.1)

## 2017-03-18 LAB — C DIFFICILE QUICK SCREEN W PCR REFLEX
C Diff antigen: NEGATIVE
C Diff interpretation: NOT DETECTED
C Diff toxin: NEGATIVE

## 2017-03-18 LAB — LIPASE, BLOOD: Lipase: 17 U/L (ref 11–51)

## 2017-03-18 LAB — POCT PREGNANCY, URINE: PREG TEST UR: NEGATIVE

## 2017-03-18 MED ORDER — PROMETHAZINE HCL 25 MG/ML IJ SOLN
25.0000 mg | Freq: Once | INTRAMUSCULAR | Status: AC
  Administered 2017-03-18: 25 mg via INTRAVENOUS

## 2017-03-18 MED ORDER — ONDANSETRON HCL 4 MG/2ML IJ SOLN
INTRAMUSCULAR | Status: AC
  Filled 2017-03-18: qty 2

## 2017-03-18 MED ORDER — ENOXAPARIN SODIUM 40 MG/0.4ML ~~LOC~~ SOLN
40.0000 mg | SUBCUTANEOUS | Status: DC
  Filled 2017-03-18: qty 0.4

## 2017-03-18 MED ORDER — PNEUMOCOCCAL VAC POLYVALENT 25 MCG/0.5ML IJ INJ
0.5000 mL | INJECTION | INTRAMUSCULAR | Status: AC
Start: 2017-03-19 — End: 2017-03-19
  Administered 2017-03-19: 0.5 mL via INTRAMUSCULAR
  Filled 2017-03-18: qty 0.5

## 2017-03-18 MED ORDER — ONDANSETRON HCL 4 MG/2ML IJ SOLN
4.0000 mg | Freq: Once | INTRAMUSCULAR | Status: AC
  Administered 2017-03-18: 4 mg via INTRAVENOUS

## 2017-03-18 MED ORDER — SODIUM CHLORIDE 0.9 % IV BOLUS (SEPSIS)
1000.0000 mL | Freq: Once | INTRAVENOUS | Status: AC
  Administered 2017-03-18: 1000 mL via INTRAVENOUS

## 2017-03-18 MED ORDER — PROMETHAZINE HCL 25 MG/ML IJ SOLN
INTRAMUSCULAR | Status: AC
  Administered 2017-03-18: 25 mg via INTRAVENOUS
  Filled 2017-03-18: qty 1

## 2017-03-18 MED ORDER — BISMUTH SUBSALICYLATE 262 MG/15ML PO SUSP
30.0000 mL | ORAL | Status: DC | PRN
  Administered 2017-03-18: 30 mL via ORAL
  Filled 2017-03-18: qty 118

## 2017-03-18 MED ORDER — POTASSIUM CHLORIDE IN NACL 20-0.9 MEQ/L-% IV SOLN
INTRAVENOUS | Status: DC
  Administered 2017-03-19 – 2017-03-20 (×4): via INTRAVENOUS
  Filled 2017-03-18 (×5): qty 1000

## 2017-03-18 MED ORDER — ONDANSETRON HCL 4 MG PO TABS: 4.0000 mg | ORAL_TABLET | Freq: Four times a day (QID) | ORAL | Status: DC | PRN

## 2017-03-18 MED ORDER — ACETAMINOPHEN 650 MG RE SUPP: 650.0000 mg | Freq: Four times a day (QID) | RECTAL | Status: DC | PRN

## 2017-03-18 MED ORDER — SODIUM CHLORIDE 0.9 % IV SOLN
Freq: Once | INTRAVENOUS | Status: AC
  Administered 2017-03-18: 21:00:00 via INTRAVENOUS

## 2017-03-18 MED ORDER — ACETAMINOPHEN 325 MG PO TABS
650.0000 mg | ORAL_TABLET | Freq: Four times a day (QID) | ORAL | Status: DC | PRN
  Administered 2017-03-18 – 2017-03-20 (×5): 650 mg via ORAL
  Filled 2017-03-18 (×5): qty 2

## 2017-03-18 MED ORDER — ONDANSETRON HCL 4 MG/2ML IJ SOLN: 4.0000 mg | Freq: Four times a day (QID) | INTRAMUSCULAR | Status: DC | PRN

## 2017-03-18 NOTE — ED Notes (Signed)
Pt states her dizziness is back and her nausea is not improved.

## 2017-03-18 NOTE — ED Triage Notes (Signed)
Pt states that she has had N/V/D since this morning at 0830. Pt reports feeling weak. States that she has had diarrhea over 30 times.

## 2017-03-18 NOTE — H&P (Signed)
PCP:   [facility]   Chief Complaint:  nausea, vomiting, diarrhea  HPI: This is a 21 year old female who today developed nausea vomiting and diarrhea. She's had profuse diarrhea, stating she is going to the bathroom every 20 minutes. He denies any evidence of bleeding. She states she's feeling lightheaded and dizzy. She reports chills but no fevers. She finally came to ER. In the ER the patient is orthostatic by pulse with a range from 80-113. Her nausea is improved but she continues to have diarrhea. The hospitalist have been asked to admit. She has no sick contacts.  Review of Systems:  The patient denies anorexia, fever, weight loss,, vision loss, decreased hearing, hoarseness, chest pain, syncope, dyspnea on exertion, peripheral edema, balance deficits, hemoptysis, nausea, vomiting, diarrhea, bdominal pain, melena, hematochezia, severe indigestion/heartburn, hematuria, incontinence, genital sores, muscle weakness, suspicious skin lesions, transient blindness, difficulty walking, depression, unusual weight change, abnormal bleeding, enlarged lymph nodes, angioedema, and breast masses.  Past Medical History: Past Medical History:  Diagnosis Date  . Medical history non-contributory    Past Surgical History:  Procedure Laterality Date  . denies sx history    . NO PAST SURGERIES      Medications: Prior to Admission medications   None    Allergies:  No Known Allergies  Social History:  reports that she has been smoking.  She has been smoking about 0.50 packs per day. She has never used smokeless tobacco. She reports that she does not drink alcohol or use drugs.  Family History: Family History  Problem Relation Age of Onset  . Pancreatic cancer Maternal Grandmother   . Diabetes Maternal Grandfather     Physical Exam: Vitals:   03/18/17 1645 03/18/17 1648 03/18/17 1843 03/18/17 1915  BP: (!) 131/57 (!) 131/57 128/83 (!) 115/58  Pulse: 81 92 (!) 105    Resp:  16 16   Temp:      TempSrc:      SpO2: 99% 98% 100%   Weight:      Height:        General:  Alert and oriented times three, well developed and nourished, no acute distress Eyes: PERRLA, pink conjunctiva, no scleral icterus ENT: Moist oral mucosa, neck supple, no thyromegaly Lungs: clear to ascultation, no wheeze, no crackles, no use of accessory muscles Cardiovascular: regular rate and rhythm, no regurgitation, no gallops, no murmurs. No carotid bruits, no JVD Abdomen: soft, positive BS, non-tender, non-distended, no organomegaly, not an acute abdomen GU: not examined Neuro: CN II - XII grossly intact, sensation intact Musculoskeletal: strength 5/5 all extremities, no clubbing, cyanosis or edema Skin: no rash, no subcutaneous crepitation, no decubitus Psych: appropriate patient   Labs on Admission:   Recent Labs  03/18/17 1544  NA 139  K 3.8  CL 109  CO2 21*  GLUCOSE 95  BUN 12  CREATININE 0.76  CALCIUM 9.3    Recent Labs  03/18/17 1544  AST 18  ALT 13*  ALKPHOS 86  BILITOT 1.0  PROT 7.9  ALBUMIN 4.6    Recent Labs  03/18/17 1544  LIPASE 17    Recent Labs  03/18/17 1544  WBC 13.0*  NEUTROABS 11.1*  HGB 14.7  HCT 41.5  MCV 86.5  PLT 197   No results for input(s): CKTOTAL, CKMB, CKMBINDEX, TROPONINI in the last 72 hours. Invalid input(s): POCBNP No results for input(s): DDIMER in the last 72 hours. No results for input(s): HGBA1C in the last 72 hours. No results  for input(s): CHOL, HDL, LDLCALC, TRIG, CHOLHDL, LDLDIRECT in the last 72 hours. No results for input(s): TSH, T4TOTAL, T3FREE, THYROIDAB in the last 72 hours.  Invalid input(s): FREET3 No results for input(s): VITAMINB12, FOLATE, FERRITIN, TIBC, IRON, RETICCTPCT in the last 72 hours.  Micro Results: Recent Results (from the past 240 hour(s))  Gastrointestinal Panel by PCR , Stool     Status: Abnormal   Collection Time: 03/18/17  5:35 PM  Result Value Ref Range Status    Campylobacter species NOT DETECTED NOT DETECTED Final   Plesimonas shigelloides NOT DETECTED NOT DETECTED Final   Salmonella species NOT DETECTED NOT DETECTED Final   Yersinia enterocolitica NOT DETECTED NOT DETECTED Final   Vibrio species NOT DETECTED NOT DETECTED Final   Vibrio cholerae NOT DETECTED NOT DETECTED Final   Enteroaggregative E coli (EAEC) NOT DETECTED NOT DETECTED Final   Enteropathogenic E coli (EPEC) NOT DETECTED NOT DETECTED Final   Enterotoxigenic E coli (ETEC) NOT DETECTED NOT DETECTED Final   Shiga like toxin producing E coli (STEC) NOT DETECTED NOT DETECTED Final   Shigella/Enteroinvasive E coli (EIEC) NOT DETECTED NOT DETECTED Final   Cryptosporidium NOT DETECTED NOT DETECTED Final   Cyclospora cayetanensis NOT DETECTED NOT DETECTED Final   Entamoeba histolytica NOT DETECTED NOT DETECTED Final   Giardia lamblia NOT DETECTED NOT DETECTED Final   Adenovirus F40/41 NOT DETECTED NOT DETECTED Final   Astrovirus NOT DETECTED NOT DETECTED Final   Norovirus GI/GII DETECTED (A) NOT DETECTED Final    Comment: RESULT CALLED TO, READ BACK BY AND VERIFIED WITH: PAULETTE WYATT 03/18/17 @ 1913  MLK    Rotavirus A NOT DETECTED NOT DETECTED Final   Sapovirus (I, II, IV, and V) NOT DETECTED NOT DETECTED Final  C difficile quick scan w PCR reflex     Status: None   Collection Time: 03/18/17  5:35 PM  Result Value Ref Range Status   C Diff antigen NEGATIVE NEGATIVE Final   C Diff toxin NEGATIVE NEGATIVE Final   C Diff interpretation No C. difficile detected.  Final     Radiological Exams on Admission: No results found.  Assessment/Plan Present on Admission: . Dehydration . Norovirus . Nausea & vomiting . Diarrhea -Bring in for observation overnight on med surge -Patient is already received 3 L normal saline in the ER. Repeat orthostatics vitals, today and in the a.m. -Continue IV fluid hydration, pain medications when necessary  Tobacco abuse -Nicotine  patch    Burl Tauzin 03/18/2017, 8:28 PM

## 2017-03-18 NOTE — ED Notes (Signed)
Patient insistent that she go outside and smoke, explained that IV would have to be removed then restarted on return to the ED for 3rd bag of saline and admission, patient is agreeable to this.

## 2017-03-18 NOTE — ED Notes (Signed)
Patient denies pain and is resting comfortably.  

## 2017-03-18 NOTE — ED Notes (Signed)
Pt transported to room 223 

## 2017-03-18 NOTE — ED Notes (Signed)
Admitting MD made aware of patients request to go outside and smoke.

## 2017-03-18 NOTE — ED Provider Notes (Addendum)
[facility] Emergency Department Provider Note   ____________________________________________   First MD Initiated Contact with Patient 03/18/17 1542     (approximate)  I have reviewed the triage vital signs and the nursing notes.   HISTORY  Chief Complaint Emesis and Diarrhea    HPI Dawn Chambers is a 21 y.o. female Who reports she got up this morning and since then she's been having nausea vomiting and diarrhea. She's had diarrhea about 30 times today. She has crampy abdominal pain no fever she gets lightheaded when she stands up. No one else is sick at home she has no past medical history has taken no medicines except for her implant that she has to keep her from getting pregnant and has not had any antibiotics in the last few months.   Past Medical History:  Diagnosis Date  . Medical history non-contributory     Patient Active Problem List   Diagnosis Date Noted  . Postpartum care following vaginal delivery 09/07/2016  . Gestational hypertension 09/05/2016  . Labor and delivery, indication for care 07/23/2015    Past Surgical History:  Procedure Laterality Date  . denies sx history    . NO PAST SURGERIES      Prior to Admission medications   Not on File    Allergies Patient has no known allergies.  Family History  Problem Relation Age of Onset  . Pancreatic cancer Maternal Grandmother   . Diabetes Maternal Grandfather     Social History Social History  Substance Use Topics  . Smoking status: Current Every Day Smoker    Packs/day: 0.50  . Smokeless tobacco: Never Used     Comment: 1/2 ppd  . Alcohol use No    Review of Systems  Constitutional: No fever/chills Eyes: No visual changes. ENT: No sore throat. Cardiovascular: Denies chest pain. Respiratory: Denies shortness of breath. Gastrointestinal: see history of present illnessn. Genitourinary: Negative for dysuria. Musculoskeletal: Negative for back pain. Skin:  Negative for rash. Neurological: Negative for headaches, focal weakness   ____________________________________________   PHYSICAL EXAM:  VITAL SIGNS: ED Triage Vitals [03/18/17 1535]  Enc Vitals Group     BP (!) 105/52     Pulse Rate 96     Resp 16     Temp (!) 97.5 F (36.4 C)     Temp Source Oral     SpO2 100 %     Weight 140 lb (63.5 kg)     Height  (1.575 m)     Head Circumference      Peak Flow      Pain Score      Pain Loc      Pain Edu?      Excl. in GC?     Constitutional: Alert and oriented. Well appearing and in no acute distress. Eyes: Conjunctivae are normal.  Head: Atraumatic. Nose: No congestion/rhinnorhea. Mouth/Throat: Mucous membranes are moist.  Oropharynx non-erythematous. Neck: No stridor.   Cardiovascular: Normal rate, regular rhythm. Grossly normal heart sounds.  Good peripheral circulation. Respiratory: Normal respiratory effort.  No retractions. Lungs CTAB. Gastrointestinal: Soft and nontender. No distention. No abdominal bruits. No CVA tenderness. Musculoskeletal: No lower extremity tenderness nor edema.  No joint effusions. Neurologic:  Normal speech and language. No gross focal neurologic deficits are appreciated. No gait instability. Skin:  Skin is warm, dry and intact. No rash noted. Psychiatric: Mood and affect are normal. Speech and behavior are normal.  ____________________________________________   LABS (all labs  ordered are listed, but only abnormal results are displayed)  Labs Reviewed  GASTROINTESTINAL PANEL BY PCR, STOOL (REPLACES STOOL CULTURE) - Abnormal; Notable for the following:       Result Value   Norovirus GI/GII DETECTED (*)    All other components within normal limits  COMPREHENSIVE METABOLIC PANEL - Abnormal; Notable for the following:    CO2 21 (*)    ALT 13 (*)    All other components within normal limits  URINALYSIS, COMPLETE (UACMP) WITH MICROSCOPIC - Abnormal; Notable for the following:    Color, Urine  YELLOW (*)    APPearance CLOUDY (*)    Ketones, ur 20 (*)    Squamous Epithelial / LPF TOO NUMEROUS TO COUNT (*)    All other components within normal limits  CBC WITH DIFFERENTIAL/PLATELET - Abnormal; Notable for the following:    WBC 13.0 (*)    Neutro Abs 11.1 (*)    All other components within normal limits  C DIFFICILE QUICK SCREEN W PCR REFLEX  LIPASE, BLOOD  POCT PREGNANCY, URINE   ____________________________________________  EKG   ____________________________________________  RADIOLOGY   ____________________________________________   PROCEDURES  Procedure(s) performed:   Procedures  Critical Care performed:   ____________________________________________   INITIAL IMPRESSION / ASSESSMENT AND PLAN / ED COURSE  Pertinent labs & imaging results that were available during my care of the patient were reviewed by me and considered in my medical decision making (see chart for details).   patient has had 2 L IV she is had some ice chips of that she still nauseated she is having diarrhea here in the ER which is positive for nor a virus she still very orthostatic I will plan on admitting her for hydration     ____________________________________________   FINAL CLINICAL IMPRESSION(S) / ED DIAGNOSES  Final diagnoses:  Dehydration  Diarrhea of infectious origin      NEW MEDICATIONS STARTED DURING THIS VISIT:  New Prescriptions   No medications on file     Note:  This document was prepared using Dragon voice recognition software and may include unintentional dictation errors.    Arnaldo Natal, MD 03/18/17 Wynetta Emery    Arnaldo Natal, MD 03/18/17 [phone]

## 2017-03-19 LAB — CBC
HEMATOCRIT: 33.6 % — AB (ref 35.0–47.0)
Hemoglobin: 12.1 g/dL (ref 12.0–16.0)
MCH: 31.4 pg (ref 26.0–34.0)
MCHC: 36.1 g/dL — ABNORMAL HIGH (ref 32.0–36.0)
MCV: 87.2 fL (ref 80.0–100.0)
Platelets: 147 10*3/uL — ABNORMAL LOW (ref 150–440)
RBC: 3.86 MIL/uL (ref 3.80–5.20)
RDW: 13.1 % (ref 11.5–14.5)
WBC: 6.4 10*3/uL (ref 3.6–11.0)

## 2017-03-19 LAB — BASIC METABOLIC PANEL
Anion gap: 4 — ABNORMAL LOW (ref 5–15)
BUN: 7 mg/dL (ref 6–20)
CHLORIDE: 114 mmol/L — AB (ref 101–111)
CO2: 21 mmol/L — ABNORMAL LOW (ref 22–32)
Calcium: 7.7 mg/dL — ABNORMAL LOW (ref 8.9–10.3)
Creatinine, Ser: 0.63 mg/dL (ref 0.44–1.00)
GFR calc Af Amer: >60 mL/min (ref >60)
GLUCOSE: 89 mg/dL (ref 65–99)
POTASSIUM: 3.6 mmol/L (ref 3.5–5.1)
SODIUM: 139 mmol/L (ref 135–145)

## 2017-03-19 MED ORDER — NICOTINE 14 MG/24HR TD PT24
14.0000 mg | MEDICATED_PATCH | Freq: Every day | TRANSDERMAL | Status: DC
  Filled 2017-03-19: qty 1

## 2017-03-19 MED ORDER — OXYCODONE HCL 5 MG PO TABS
5.0000 mg | ORAL_TABLET | Freq: Four times a day (QID) | ORAL | Status: DC | PRN
  Administered 2017-03-19 (×2): 5 mg via ORAL
  Filled 2017-03-19 (×2): qty 1

## 2017-03-19 MED ORDER — LOPERAMIDE HCL 2 MG PO CAPS: 2.0000 mg | ORAL_CAPSULE | Freq: Three times a day (TID) | ORAL | Status: DC | PRN

## 2017-03-19 MED ORDER — POLYVINYL ALCOHOL 1.4 % OP SOLN
1.0000 [drp] | OPHTHALMIC | Status: DC | PRN
  Filled 2017-03-19: qty 15

## 2017-03-19 NOTE — Progress Notes (Signed)
Pt refused Nicotine patch and asked if she could go smoke 1 cigarette. RN paged MD and was awaiting response. Pt became very anxious and agitated and stated she was going to smoke a cigarette. Pt left the unit with family and returned a short time after more calm.

## 2017-03-19 NOTE — Progress Notes (Signed)
Patient ID: Dawn Chambers, female   DOB: [birth year], 21 y.o.   MRN: [id number]  [employer] PROGRESS NOTE  Dawn Chambers:[id number] DOB: [birth year] DOA: 03/18/2017 [facility]CP: [facility]  HPI/Subjective: Patient feeling tired. No one at home that is sick. She did go [place] the zoo over the weekend. Positive for norovirus. Still having watery diarrhea. Having back pain.  Objective: Vitals:   03/19/17 0545 03/19/17 1345  BP: 111/65 112/72  Pulse: 89 72  Resp:  16  Temp:  98 F (36.7 C)  SpO2: 100% 100%    Filed Weights   03/18/17 1535  Weight: 63.5 kg (140 lb)    ROS: Review of Systems  Constitutional: Negative for chills and fever.  Eyes: Negative for blurred vision.  Respiratory: Negative for cough and shortness of breath.   Cardiovascular: Negative for chest pain.  Gastrointestinal: Positive for diarrhea. Negative for abdominal pain, constipation, nausea and vomiting.  Genitourinary: Negative for dysuria.  Musculoskeletal: Positive for back pain. Negative for joint pain.  Neurological: Negative for dizziness and headaches.   Exam: Physical Exam  Constitutional: She is oriented to person, place, and time.  HENT:  Nose: No mucosal edema.  Mouth/Throat: No oropharyngeal exudate or posterior oropharyngeal edema.  Eyes: Pupils are equal, round, and reactive to light. Conjunctivae, EOM and lids are normal.  Neck: No JVD present. Carotid bruit is not present. No edema present. No thyroid mass and no thyromegaly present.  Cardiovascular: S1 normal and S2 normal.  Exam reveals no gallop.   No murmur heard. Pulses:      Dorsalis pedis pulses are 2+ on the right side, and 2+ on the left side.  Respiratory: No respiratory distress. She has no wheezes. She has no rhonchi. She has no rales.  GI: Soft. Bowel sounds are normal. There is no tenderness.  Musculoskeletal:       Right ankle: She exhibits no swelling.       Left ankle: She exhibits no swelling.   Lymphadenopathy:    She has no cervical adenopathy.  Neurological: She is alert and oriented to person, place, and time. No cranial nerve deficit.  Skin: Skin is warm. No rash noted. Nails show no clubbing.  Psychiatric: She has a normal mood and affect.      Data Reviewed: Basic Metabolic Panel:  Recent Labs Lab 03/18/17 1544 03/19/17 0332  NA 139 139  K 3.8 3.6  CL 109 114*  CO2 21* 21*  GLUCOSE 95 89  BUN 12 7  CREATININE 0.76 0.63  CALCIUM 9.3 7.7*   Liver Function Tests:  Recent Labs Lab 03/18/17 1544  AST 18  ALT 13*  ALKPHOS 86  BILITOT 1.0  PROT 7.9  ALBUMIN 4.6    Recent Labs Lab 03/18/17 1544  LIPASE 17   CBC:  Recent Labs Lab 03/18/17 1544 03/19/17 0332  WBC 13.0* 6.4  NEUTROABS 11.1*  --   HGB 14.7 12.1  HCT 41.5 33.6*  MCV 86.5 87.2  PLT 197 147*    Recent Results (from the past 240 hour(s))  Gastrointestinal Panel by PCR , Stool     Status: Abnormal   Collection Time: 03/18/17  5:35 PM  Result Value Ref Range Status   Campylobacter species NOT DETECTED NOT DETECTED Final   Plesimonas shigelloides NOT DETECTED NOT DETECTED Final   Salmonella species NOT DETECTED NOT DETECTED Final   Yersinia enterocolitica NOT DETECTED NOT DETECTED Final   Vibrio species NOT DETECTED NOT DETECTED  Final   Vibrio cholerae NOT DETECTED NOT DETECTED Final   Enteroaggregative E coli (EAEC) NOT DETECTED NOT DETECTED Final   Enteropathogenic E coli (EPEC) NOT DETECTED NOT DETECTED Final   Enterotoxigenic E coli (ETEC) NOT DETECTED NOT DETECTED Final   Shiga like toxin producing E coli (STEC) NOT DETECTED NOT DETECTED Final   Shigella/Enteroinvasive E coli (EIEC) NOT DETECTED NOT DETECTED Final   Cryptosporidium NOT DETECTED NOT DETECTED Final   Cyclospora cayetanensis NOT DETECTED NOT DETECTED Final   Entamoeba histolytica NOT DETECTED NOT DETECTED Final   Giardia lamblia NOT DETECTED NOT DETECTED Final   Adenovirus F40/41 NOT DETECTED NOT DETECTED  Final   Astrovirus NOT DETECTED NOT DETECTED Final   Norovirus GI/GII DETECTED (A) NOT DETECTED Final    Comment: RESULT CALLED TO, READ BACK BY AND VERIFIED WITH: PAULETTE WYATT 03/18/17 @ 1913  MLK    Rotavirus A NOT DETECTED NOT DETECTED Final   Sapovirus (I, II, IV, and V) NOT DETECTED NOT DETECTED Final  C difficile quick scan w PCR reflex     Status: None   Collection Time: 03/18/17  5:35 PM  Result Value Ref Range Status   C Diff antigen NEGATIVE NEGATIVE Final   C Diff toxin NEGATIVE NEGATIVE Final   C Diff interpretation No C. difficile detected.  Final     Scheduled Meds: . enoxaparin (LOVENOX) injection  40 mg Subcutaneous Q24H   Continuous Infusions: . 0.9 % NaCl with KCl 20 mEq / L 125 mL/hr at 03/19/17 0814    Assessment/Plan:  1. Norovirus diarrhea. Supportive care with IV fluids. When necessary Imodium. 2. Hypokalemia replace potassium in IV fluids. Decrease rate of IV fluids. Check a magnesium tomorrow morning 3. Back pain.. Oxycodone prescribed.  Code Status:     Code Status Orders        Start     Ordered   03/18/17 2129  Full code  Continuous     03/18/17 2128    Code Status History    Date Active Date Inactive Code Status Order ID Comments User Context   09/06/2016  7:33 AM 09/07/2016  3:57 PM Full Code [id number]  Conard Novak, MD Inpatient   09/05/2016  7:44 PM 09/06/2016  7:33 AM Full Code [id number]  Conard Novak, MD Inpatient   09/05/2016  5:48 PM 09/05/2016  7:44 PM Full Code [id number]  Conard Novak, MD Inpatient   07/23/2015  3:52 PM 07/25/2015  3:46 PM Full Code [id number]  Farrel Conners, CNM Inpatient   07/23/2015  1:38 AM 07/23/2015  3:52 PM Full Code [id number]  Tresea Mall, CNM Inpatient     Disposition Plan: Potentially home tomorrow  Time spent: 35 minutes  Alford Highland  [employer]

## 2017-03-20 LAB — BASIC METABOLIC PANEL
ANION GAP: 6 (ref 5–15)
CHLORIDE: 109 mmol/L (ref 101–111)
CO2: 24 mmol/L (ref 22–32)
Calcium: 8.3 mg/dL — ABNORMAL LOW (ref 8.9–10.3)
Creatinine, Ser: 0.71 mg/dL (ref 0.44–1.00)
GFR calc Af Amer: >60 mL/min (ref >60)
GFR calc non Af Amer: >60 mL/min (ref >60)
GLUCOSE: 85 mg/dL (ref 65–99)
POTASSIUM: 4.1 mmol/L (ref 3.5–5.1)
Sodium: 139 mmol/L (ref 135–145)

## 2017-03-20 LAB — MAGNESIUM: MAGNESIUM: 1.8 mg/dL (ref 1.7–2.4)

## 2017-03-20 MED ORDER — MAGNESIUM SULFATE 2 GM/50ML IV SOLN
2.0000 g | Freq: Once | INTRAVENOUS | Status: AC
  Administered 2017-03-20: 2 g via INTRAVENOUS
  Filled 2017-03-20: qty 50

## 2017-03-20 MED ORDER — LOPERAMIDE HCL 2 MG PO CAPS: 2.0000 mg | ORAL_CAPSULE | Freq: Three times a day (TID) | ORAL | 0 refills | Status: DC | PRN

## 2017-03-20 NOTE — Progress Notes (Signed)
Pt d/c to home today.  IV removed intact.  Rx's given to pt w/all questions and concerns addressed.  D/C paperwork reviewed and education provided with all questions and concerns addressed.  Pt mother at bedside for home transport.   

## 2017-03-20 NOTE — Progress Notes (Signed)
Instructed patient at the beginning of shift that she could not leave the floor/her room, d/t Isolation for Norovirus, that it was infectious; voiced understanding. Windy Carina, RN 1:01 AM 03/20/2017

## 2017-03-20 NOTE — Discharge Summary (Signed)
[employer] [facility] [place] [facility]   PATIENT NAME: Dawn Chambers    MR#:  [id number]  DATE OF BIRTH:  [birth year]  DATE OF ADMISSION:  03/18/2017 ADMITTING PHYSICIAN: Gery Pray, MD  DATE OF DISCHARGE: 03/20/2017 12:23 PM  PRIMARY CARE PHYSICIAN: [facility]    ADMISSION DIAGNOSIS:  Dehydration [E86.0] Diarrhea of infectious origin [A09]  DISCHARGE DIAGNOSIS:  Active Problems:   Dehydration   Norovirus   Nausea & vomiting   Diarrhea   SECONDARY DIAGNOSIS:   Past Medical History:  Diagnosis Date  . Medical history non-contributory     HOSPITAL COURSE:   1.  Norovirus gastroenteritis. Patient admitted with nausea vomiting and diarrhea. She was found to have a norovirus infection. Supportive care with IV fluid hydration.  Patient still needs to be careful with contact precautions around her young children. 2. Orthostatic hypotension and dehydration. Patient was given IV fluid hydration 3. Hypokalemia and hypomagnesemia electrolytes were replaced during the hospital course 4. Patient complains of right shoulder pain.  She did receive the Pneumovax. Spoke with nursing staff about why she would receive this. Area was a little bit swollen and at the injection site was a little bit red but no signs of infection. 5. Back pain. 6. Tobacco abuse. Smoking cessation counseling done 4 minutes by me. Patient refuses nicotine patch and likely will go back to smoking. 7. Thrombocytopenia upon discharge likely secondary to viral infection  DISCHARGE CONDITIONS:   satisfactory  CONSULTS OBTAINED:  none  DRUG ALLERGIES:  No Known Allergies  DISCHARGE MEDICATIONS:   Discharge Medication List as of 03/20/2017  8:16 AM    START taking these medications   Details  loperamide (IMODIUM) 2 MG capsule Take 1 capsule (2 mg total) by mouth every 8 (eight) hours as needed for diarrhea or loose stools., Starting Tue 03/20/2017, Print          DISCHARGE INSTRUCTIONS:   Follow-up PMD one week  If you experience worsening of your admission symptoms, develop shortness of breath, life threatening emergency, suicidal or homicidal thoughts you must seek medical attention immediately by calling 911 or calling your MD immediately  if symptoms less severe.  You Must read complete instructions/literature along with all the possible adverse reactions/side effects for all the Medicines you take and that have been prescribed to you. Take any new Medicines after you have completely understood and accept all the possible adverse reactions/side effects.   Please note  You were cared for by a hospitalist during your hospital stay. If you have any questions about your discharge medications or the care you received while you were in the hospital after you are discharged, you can call the unit and asked to speak with the hospitalist on call if the hospitalist that took care of you is not available. Once you are discharged, your primary care physician will handle any further medical issues. Please note that NO REFILLS for any discharge medications will be authorized once you are discharged, as it is imperative that you return to your primary care physician (or establish a relationship with a primary care physician if you do not have one) for your aftercare needs so that they can reassess your need for medications and monitor your lab values.    Today   CHIEF COMPLAINT:   Chief Complaint  Patient presents with  . Emesis  . Diarrhea    HISTORY OF PRESENT ILLNESS:  Dawn Chambers  is a 21 y.o. female presented  with nausea vomiting and diarrhea   VITAL SIGNS:  Blood pressure 125/68, pulse 67, temperature 98.4 F (36.9 C), temperature source Oral, resp. rate 20, height  (1.575 m), weight 63.5 kg (140 lb), last menstrual period 02/18/2017, SpO2 100 %, unknown if currently breastfeeding.    PHYSICAL EXAMINATION:  GENERAL:  21  y.o.-year-old patient lying in the bed with no acute distress.  EYES: Pupils equal, round, reactive to light and accommodation. No scleral icterus. Extraocular muscles intact.  HEENT: Head atraumatic, normocephalic. Oropharynx and nasopharynx clear.  NECK:  Supple, no jugular venous distention. No thyroid enlargement, no tenderness.  LUNGS: Normal breath sounds bilaterally, no wheezing, rales,rhonchi or crepitation. No use of accessory muscles of respiration.  CARDIOVASCULAR: S1, S2 normal. No murmurs, rubs, or gallops.  ABDOMEN: Soft, non-tender, non-distended. Bowel sounds present. No organomegaly or mass.  EXTREMITIES: No pedal edema, cyanosis, or clubbing.  NEUROLOGIC: Cranial nerves II through XII are intact. Muscle strength 5/5 in all extremities. Sensation intact. Gait not checked.  PSYCHIATRIC: The patient is alert and oriented x 3.  SKIN: No obvious rash, lesion, or ulcer.   DATA REVIEW:   CBC  Recent Labs Lab 03/19/17 0332  WBC 6.4  HGB 12.1  HCT 33.6*  PLT 147*    Chemistries   Recent Labs Lab 03/18/17 1544  03/20/17 0327  NA 139  < > 139  K 3.8  < > 4.1  CL 109  < > 109  CO2 21*  < > 24  GLUCOSE 95  < > 85  BUN 12  < > <5*  CREATININE 0.76  < > 0.71  CALCIUM 9.3  < > 8.3*  MG  --   --  1.8  AST 18  --   --   ALT 13*  --   --   ALKPHOS 86  --   --   BILITOT 1.0  --   --   < > = values in this interval not displayed.   Microbiology Results  Results for orders placed or performed during the hospital encounter of 03/18/17  Gastrointestinal Panel by PCR , Stool     Status: Abnormal   Collection Time: 03/18/17  5:35 PM  Result Value Ref Range Status   Campylobacter species NOT DETECTED NOT DETECTED Final   Plesimonas shigelloides NOT DETECTED NOT DETECTED Final   Salmonella species NOT DETECTED NOT DETECTED Final   Yersinia enterocolitica NOT DETECTED NOT DETECTED Final   Vibrio species NOT DETECTED NOT DETECTED Final   Vibrio cholerae NOT DETECTED NOT  DETECTED Final   Enteroaggregative E coli (EAEC) NOT DETECTED NOT DETECTED Final   Enteropathogenic E coli (EPEC) NOT DETECTED NOT DETECTED Final   Enterotoxigenic E coli (ETEC) NOT DETECTED NOT DETECTED Final   Shiga like toxin producing E coli (STEC) NOT DETECTED NOT DETECTED Final   Shigella/Enteroinvasive E coli (EIEC) NOT DETECTED NOT DETECTED Final   Cryptosporidium NOT DETECTED NOT DETECTED Final   Cyclospora cayetanensis NOT DETECTED NOT DETECTED Final   Entamoeba histolytica NOT DETECTED NOT DETECTED Final   Giardia lamblia NOT DETECTED NOT DETECTED Final   Adenovirus F40/41 NOT DETECTED NOT DETECTED Final   Astrovirus NOT DETECTED NOT DETECTED Final   Norovirus GI/GII DETECTED (A) NOT DETECTED Final    Comment: RESULT CALLED TO, READ BACK BY AND VERIFIED WITH: PAULETTE WYATT 03/18/17 @ 1913  MLK    Rotavirus A NOT DETECTED NOT DETECTED Final   Sapovirus (I, II, IV, and V) NOT DETECTED NOT  DETECTED Final  C difficile quick scan w PCR reflex     Status: None   Collection Time: 03/18/17  5:35 PM  Result Value Ref Range Status   C Diff antigen NEGATIVE NEGATIVE Final   C Diff toxin NEGATIVE NEGATIVE Final   C Diff interpretation No C. difficile detected.  Final     Management plans discussed with the patient, and she is in agreement.  CODE STATUS:  Code Status History    Date Active Date Inactive Code Status Order ID Comments User Context   03/18/2017  9:28 PM 03/20/2017  3:28 PM Full Code [id number]  Gery Pray, MD Inpatient   09/06/2016  7:33 AM 09/07/2016  3:57 PM Full Code [id number]  Conard Novak, MD Inpatient   09/05/2016  7:44 PM 09/06/2016  7:33 AM Full Code [id number]  Conard Novak, MD Inpatient   09/05/2016  5:48 PM 09/05/2016  7:44 PM Full Code [id number]  Conard Novak, MD Inpatient   07/23/2015  3:52 PM 07/25/2015  3:46 PM Full Code [id number]  Farrel Conners, CNM Inpatient   07/23/2015  1:38 AM 07/23/2015  3:52 PM Full Code [id number]  Tresea Mall, CNM  Inpatient      TOTAL TIME TAKING CARE OF THIS PATIENT: 32 minutes.    Alford Highland M.D on 03/20/2017 at 3:49 PM  Between 7am to 6pm - Pager - [phone]  After 6pm go to [email or web address] - Social research officer, government  [employer] Office  [phone]  CC: Primary care physician; [facility]

## 2017-03-20 NOTE — Progress Notes (Signed)
Patient ID: Dawn Chambers, female   DOB: September 08, 1995, 21 y.o.   MRN: [id number] [facility] - [place] at [facility]        Omaya Nieland was admitted to the Hospital on 03/18/2017 and Discharged  03/20/2017 and should be excused from work/school   for 4 days starting 03/18/2017 , may return to work/school without any restrictions.  Alford Highland M.D on 03/20/2017,at 9:53 AM  [employer] - [place] at [facility]  [phone]

## 2017-03-20 NOTE — Progress Notes (Signed)
Patient demanded eye drops for her dry, puffy eyes; patient's mother also came to desk to request eye drops; artificial tears order placed (standing order); told patient and mother, that this RN would bring them to her, whenever they come up from pharmacy. Brought eye drops to patient and she stated that she no longer needed them. Told patient that they would be available to her, if she should change her mind. Windy Carina, RN 03/20/2017 12:59 AM

## 2017-03-21 ENCOUNTER — Emergency Department
Admission: EM | Admit: 2017-03-21 | Discharge: 2017-03-21 | Disposition: A | Discharge status: Home / Self Care | Payer: Medicaid Other

## 2017-03-22 ENCOUNTER — Emergency Department
Admission: EM | Admit: 2017-03-22 | Discharge: 2017-03-22 | Disposition: A | Discharge status: Home / Self Care | Payer: Medicaid Other | Attending: Emergency Medicine | Admitting: Emergency Medicine

## 2017-03-22 ENCOUNTER — Encounter: Payer: Self-pay | Admitting: Emergency Medicine

## 2017-03-22 DIAGNOSIS — F17200 Nicotine dependence, unspecified, uncomplicated: Secondary | ICD-10-CM | POA: Insufficient documentation

## 2017-03-22 DIAGNOSIS — R21 Rash and other nonspecific skin eruption: Secondary | ICD-10-CM | POA: Insufficient documentation

## 2017-03-22 DIAGNOSIS — T50Z95A Adverse effect of other vaccines and biological substances, initial encounter: Secondary | ICD-10-CM

## 2017-03-22 DIAGNOSIS — T50B95A Adverse effect of other viral vaccines, initial encounter: Secondary | ICD-10-CM | POA: Insufficient documentation

## 2017-03-22 MED ORDER — CLINDAMYCIN HCL 300 MG PO CAPS: 300.0000 mg | ORAL_CAPSULE | Freq: Three times a day (TID) | ORAL | 0 refills | Status: AC

## 2017-03-22 MED ORDER — PREDNISONE 50 MG PO TABS
ORAL_TABLET | ORAL | 0 refills | Status: DC
Start: 2017-03-22 — End: 2017-04-30

## 2017-03-22 NOTE — ED Triage Notes (Signed)
Patient presents to ED via POV from home with two red areas noted to right deltoid. Patient states on Monday she received her pneumonia vaccine in this location. Denies SOB.

## 2017-03-22 NOTE — ED Notes (Signed)
See triage note  Presents with a red swollen area to right upper arm  States she was given the pneumonia shot on Monday  noticed redness yesterday  But today area is larger and more painful

## 2017-03-22 NOTE — ED Provider Notes (Signed)
[facility] Emergency Department Provider Note  ____________________________________________  Time seen: Approximately 11:56 AM  I have reviewed the triage vital signs and the nursing notes.   HISTORY  Chief Complaint Rash    HPI Dawn Chambers is a 21 y.o. female that presents to the emergency department for evaluation of right arm rash for 1 day. Patient got the pneumonia vaccine on Monday. Rash started yesterday where vaccine was administered and is painful. Rash is bigger today. Patient took Benadryl yesterday, which did not help.No fever, shortness of breath.   Past Medical History:  Diagnosis Date  . Medical history non-contributory     Patient Active Problem List   Diagnosis Date Noted  . Dehydration 03/18/2017  . Norovirus 03/18/2017  . Nausea & vomiting 03/18/2017  . Diarrhea 03/18/2017  . Postpartum care following vaginal delivery 09/07/2016  . Gestational hypertension 09/05/2016  . Labor and delivery, indication for care 07/23/2015    Past Surgical History:  Procedure Laterality Date  . denies sx history    . NO PAST SURGERIES      Prior to Admission medications   Medication Sig Start Date End Date Taking? Authorizing Provider  clindamycin (CLEOCIN) 300 MG capsule Take 1 capsule (300 mg total) by mouth 3 (three) times daily. 03/22/17 04/01/17  Enid Derry, PA-C  loperamide (IMODIUM) 2 MG capsule Take 1 capsule (2 mg total) by mouth every 8 (eight) hours as needed for diarrhea or loose stools. 03/20/17   Alford Highland, MD  predniSONE (DELTASONE) 50 MG tablet Take 1 pill per day 03/22/17   Enid Derry, PA-C    Allergies Patient has no known allergies.  Family History  Problem Relation Age of Onset  . Pancreatic cancer Maternal Grandmother   . Diabetes Maternal Grandfather     Social History Social History  Substance Use Topics  . Smoking status: Current Every Day Smoker    Packs/day: 0.50  . Smokeless tobacco: Never  Used     Comment: 1/2 ppd  . Alcohol use No     Review of Systems  Constitutional: No fever/chills Cardiovascular: No chest pain. Respiratory: No SOB. Gastrointestinal: No abdominal pain.  No nausea, no vomiting.  Skin: Negative for abrasions, lacerations, ecchymosis. Positive for rash.   ____________________________________________   PHYSICAL EXAM:  VITAL SIGNS: ED Triage Vitals [03/22/17 1105]  Enc Vitals Group     BP 134/81     Pulse Rate 87     Resp 15     Temp 98.9 F (37.2 C)     Temp Source Oral     SpO2 100 %     Weight 140 lb (63.5 kg)     Height  (1.575 m)     Head Circumference      Peak Flow      Pain Score 8     Pain Loc      Pain Edu?      Excl. in GC?      Constitutional: Alert and oriented. Well appearing and in no acute distress. Eyes: Conjunctivae are normal. PERRL. EOMI. Head: Atraumatic. ENT:      Ears:      Nose: No congestion/rhinnorhea.      Mouth/Throat: Mucous membranes are moist.  Neck: No stridor.  Cardiovascular: Normal rate, regular rhythm.  Good peripheral circulation. Respiratory: Normal respiratory effort without tachypnea or retractions. Lungs CTAB. Good air entry to the bases with no decreased or absent breath sounds. Musculoskeletal: Full range of motion to all  extremities. No gross deformities appreciated.  Neurologic:  Normal speech and language. No gross focal neurologic deficits are appreciated.  Skin:  Skin is warm, dry and intact. 2 inch and 1 inch bright red patch to right deltoid.   ____________________________________________   LABS (all labs ordered are listed, but only abnormal results are displayed)  Labs Reviewed - No data to display ____________________________________________  EKG   ____________________________________________  RADIOLOGY  No results found.  ____________________________________________    PROCEDURES  Procedure(s) performed:    Procedures    Medications - No data  to display   ____________________________________________   INITIAL IMPRESSION / ASSESSMENT AND PLAN / ED COURSE  Pertinent labs & imaging results that were available during my care of the patient were reviewed by me and considered in my medical decision making (see chart for details).  Review of the [place] CSRS was performed in accordance of the [facility] prior to dispensing any controlled drugs.   Patient's diagnosis is consistent with immunization reaction. Vital signs and exam are reassuring. She will be covered for bacterial causes of rash. Patient will be discharged home with prescriptions for clindamycin and prednisone. She will continue taking Benadryl. Patient is to follow up with PCP as directed. Patient is given ED precautions to return to the ED for any worsening or new symptoms.     ____________________________________________  FINAL CLINICAL IMPRESSION(S) / ED DIAGNOSES  Final diagnoses:  Adverse effect of vaccine, initial encounter      NEW MEDICATIONS STARTED DURING THIS VISIT:  Discharge Medication List as of 03/22/2017 12:40 PM    START taking these medications   Details  clindamycin (CLEOCIN) 300 MG capsule Take 1 capsule (300 mg total) by mouth 3 (three) times daily., Starting Thu 03/22/2017, Until Sun 04/01/2017, Print    predniSONE (DELTASONE) 50 MG tablet Take 1 pill per day, Print            This chart was dictated using voice recognition software/Dragon. Despite best efforts to proofread, errors can occur which can change the meaning. Any change was purely unintentional.    Enid Derry, PA-C 03/22/17 1533    Governor Rooks, MD 03/23/17 [phone]

## 2017-04-30 ENCOUNTER — Encounter: Payer: Self-pay | Admitting: Intensive Care

## 2017-04-30 ENCOUNTER — Emergency Department
Admission: EM | Admit: 2017-04-30 | Discharge: 2017-04-30 | Disposition: A | Discharge status: Home / Self Care | Payer: Medicaid Other | Attending: Emergency Medicine | Admitting: Emergency Medicine

## 2017-04-30 DIAGNOSIS — K0889 Other specified disorders of teeth and supporting structures: Secondary | ICD-10-CM | POA: Diagnosis not present

## 2017-04-30 DIAGNOSIS — F17210 Nicotine dependence, cigarettes, uncomplicated: Secondary | ICD-10-CM | POA: Insufficient documentation

## 2017-04-30 MED ORDER — AMOXICILLIN 875 MG PO TABS: 875.0000 mg | ORAL_TABLET | Freq: Two times a day (BID) | ORAL | 0 refills | Status: DC

## 2017-04-30 MED ORDER — IBUPROFEN 600 MG PO TABS: 600.0000 mg | ORAL_TABLET | Freq: Three times a day (TID) | ORAL | 0 refills | Status: DC | PRN

## 2017-04-30 NOTE — ED Notes (Signed)
Pt. Verbalizes understanding of d/c instructions, medications, and follow-up.  Pt. In NAD at time of d/c and denies further concerns regarding this visit. Pt. Stable at the time of departure from the unit, departing unit by the safest and most appropriate manner per that pt condition and limitations with all belongings accounted for. Pt advised to return to the ED at any time for emergent concerns, or for new/worsening symptoms.   

## 2017-04-30 NOTE — ED Provider Notes (Signed)
[facility] Emergency Department Provider Note  ____________________________________________   First MD Initiated Contact with Patient 04/30/17 1527     (approximate)  I have reviewed the triage vital signs and the nursing notes.   HISTORY  Chief Complaint Dental Pain    HPI Dawn Chambers is a 21 y.o. female this with complaint of right-sided dental pain. Patient states that she had all 4 wisdom teeth removed 3 months ago. She states over the weekend she began having some difficulty with her right lower jaw in the area where her wisdom tooth was extracted. She is unaware of any fever or chills. She has not called the dentist who did the extraction.she rates her pain as an 8 out of 10.   Past Medical History:  Diagnosis Date  . Medical history non-contributory     Patient Active Problem List   Diagnosis Date Noted  . Dehydration 03/18/2017  . Norovirus 03/18/2017  . Nausea & vomiting 03/18/2017  . Diarrhea 03/18/2017  . Postpartum care following vaginal delivery 09/07/2016  . Gestational hypertension 09/05/2016  . Labor and delivery, indication for care 07/23/2015    Past Surgical History:  Procedure Laterality Date  . denies sx history    . NO PAST SURGERIES      Prior to Admission medications   Medication Sig Start Date End Date Taking? Authorizing Provider  amoxicillin (AMOXIL) 875 MG tablet Take 1 tablet (875 mg total) 2 (two) times daily by mouth. 04/30/17   Tommi RumpsSummers, Maisie Hauser L, PA-C  ibuprofen (ADVIL,MOTRIN) 600 MG tablet Take 1 tablet (600 mg total) every 8 (eight) hours as needed by mouth. 04/30/17   Tommi RumpsSummers, Marycruz Boehner L, PA-C    Allergies Patient has no known allergies.  Family History  Problem Relation Age of Onset  . Pancreatic cancer Maternal Grandmother   . Diabetes Maternal Grandfather     Social History Social History   Tobacco Use  . Smoking status: Current Every Day Smoker    Packs/day: 0.50    Types: Cigarettes    . Smokeless tobacco: Never Used  . Tobacco comment: 1/2 ppd  Substance Use Topics  . Alcohol use: No  . Drug use: No    Review of Systems Constitutional: No fever/chills ENT: positive for pain and right lower mandible Cardiovascular: Denies chest pain. Respiratory: Denies shortness of breath. Gastrointestinal:   No nausea, no vomiting.   Musculoskeletal: Negative for back pain. Neurological: Negative for headaches ____________________________________________   PHYSICAL EXAM:  VITAL SIGNS: ED Triage Vitals  Enc Vitals Group     BP 04/30/17 1441 (!) 154/88     Pulse Rate 04/30/17 1441 66     Resp 04/30/17 1441 16     Temp 04/30/17 1441 98.3 F (36.8 C)     Temp Source 04/30/17 1441 Oral     SpO2 04/30/17 1441 100 %     Weight 04/30/17 1442 140 lb (63.5 kg)     Height 04/30/17 1442 5\' 2"  (1.575 m)     Head Circumference --      Peak Flow --      Pain Score 04/30/17 1441 8     Pain Loc --      Pain Edu? --      Excl. in GC? --    Constitutional: Alert and oriented. Well appearing and in no acute distress. Eyes: Conjunctivae are normal.  Head: Atraumatic. Mouth/Throat: Mucous membranes are moist.  Oropharynx non-erythematous. Right lower extraction site with gum tenderness but no  discharge or drainage. Neck: No stridor.   Hematological/Lymphatic/Immunilogical: No cervical lymphadenopathy. Cardiovascular: Normal rate, regular rhythm. Grossly normal heart sounds.  Good peripheral circulation. Respiratory: Normal respiratory effort.  No retractions. Lungs CTAB. Skin:  Skin is warm, dry and intact. No rash noted. Psychiatric: Mood and affect are normal. Speech and behavior are normal.  ____________________________________________   LABS (all labs ordered are listed, but only abnormal results are displayed)  Labs Reviewed - No data to display  PROCEDURES  Procedure(s) performed: None  Procedures  Critical Care performed:  No  ____________________________________________   INITIAL IMPRESSION / ASSESSMENT AND PLAN / ED COURSE Patient is given a prescription for amoxicillin 875 twice a day for 10 days. A prescription for ibuprofen as needed for pain. She is to call her dentist tomorrow to make an appointment for further evaluation. ____________________________________________   FINAL CLINICAL IMPRESSION(S) / ED DIAGNOSES  Final diagnoses:  Pain, dental     ED Discharge Orders        Ordered    amoxicillin (AMOXIL) 875 MG tablet  2 times daily     04/30/17 1552    ibuprofen (ADVIL,MOTRIN) 600 MG tablet  Every 8 hours PRN     04/30/17 1552       Note:  This document was prepared using Dragon voice recognition software and may include unintentional dictation errors.    Tommi RumpsSummers, Merleen Picazo L, PA-C 04/30/17 1613    Nita SickleVeronese, , MD 05/03/17 [phone]

## 2017-04-30 NOTE — ED Triage Notes (Signed)
Patient reports getting all four wisdom teeth out X3 months ago. On Saturday she started having pain in the right lower socket and also reports having drainage. Minimal swelling noted to R side of face and c/o R ear pain.

## 2017-04-30 NOTE — Discharge Instructions (Signed)
Follow-up with your dentist. Call tomorrow for an appointment. Begin taking antibiotics as directed for the next 10 days. Ibuprofen 3 times a day with food as needed for pain.

## 2017-10-12 LAB — HIV ANTIBODY (ROUTINE TESTING W REFLEX): HIV Screen 4th Generation wRfx: NONREACTIVE

## 2017-12-17 ENCOUNTER — Emergency Department
Admission: EM | Admit: 2017-12-17 | Discharge: 2017-12-17 | Disposition: A | Discharge status: Home / Self Care | Payer: Medicaid Other | Attending: Emergency Medicine | Admitting: Emergency Medicine

## 2017-12-17 ENCOUNTER — Encounter: Payer: Self-pay | Admitting: Emergency Medicine

## 2017-12-17 DIAGNOSIS — H1031 Unspecified acute conjunctivitis, right eye: Secondary | ICD-10-CM

## 2017-12-17 DIAGNOSIS — H1089 Other conjunctivitis: Secondary | ICD-10-CM | POA: Diagnosis not present

## 2017-12-17 DIAGNOSIS — F17210 Nicotine dependence, cigarettes, uncomplicated: Secondary | ICD-10-CM | POA: Insufficient documentation

## 2017-12-17 DIAGNOSIS — H5789 Other specified disorders of eye and adnexa: Secondary | ICD-10-CM | POA: Diagnosis present

## 2017-12-17 MED ORDER — POLYMYXIN B-TRIMETHOPRIM 10000-0.1 UNIT/ML-% OP SOLN: 1.0000 [drp] | OPHTHALMIC | 0 refills | Status: AC

## 2017-12-17 MED ORDER — POLYMYXIN B-TRIMETHOPRIM 10000-0.1 UNIT/ML-% OP SOLN: 1.0000 [drp] | OPHTHALMIC | Status: DC

## 2017-12-17 MED ORDER — ERYTHROMYCIN 5 MG/GM OP OINT
TOPICAL_OINTMENT | OPHTHALMIC | Status: AC
  Administered 2017-12-17: 1 via OPHTHALMIC
  Filled 2017-12-17: qty 1

## 2017-12-17 MED ORDER — ERYTHROMYCIN 5 MG/GM OP OINT
TOPICAL_OINTMENT | Freq: Once | OPHTHALMIC | Status: AC
  Administered 2017-12-17: 1 via OPHTHALMIC

## 2017-12-17 NOTE — ED Provider Notes (Signed)
[facility] Emergency Department Provider Note  ____________________________________________  Time seen: Approximately 10:56 PM  I have reviewed the triage vital signs and the nursing notes.   HISTORY  Chief Complaint Eye Drainage and Eye Problem    HPI Dawn Chambers is a 22 y.o. female presents to the emergency department with right eye conjunctivitis, increased tearing and crusting that started earlier today.  Patient denies changes in vision or pain with eye movement.  Patient  denies globe trauma or  possible right eye foreign bodies.  She does not wear contact lenses or glasses.  Patient has 2 small children at home.  No other sick contacts in the home with similar symptoms.  No alleviating measures have been attempted.   Past Medical History:  Diagnosis Date  . Medical history non-contributory     Patient Active Problem List   Diagnosis Date Noted  . Dehydration 03/18/2017  . Norovirus 03/18/2017  . Nausea & vomiting 03/18/2017  . Diarrhea 03/18/2017  . Postpartum care following vaginal delivery 09/07/2016  . Gestational hypertension 09/05/2016  . Labor and delivery, indication for care 07/23/2015    Past Surgical History:  Procedure Laterality Date  . denies sx history    . NO PAST SURGERIES      Prior to Admission medications   Medication Sig Start Date End Date Taking? Authorizing Provider  amoxicillin (AMOXIL) 875 MG tablet Take 1 tablet (875 mg total) 2 (two) times daily by mouth. 04/30/17   Tommi Rumps, PA-C  ibuprofen (ADVIL,MOTRIN) 600 MG tablet Take 1 tablet (600 mg total) every 8 (eight) hours as needed by mouth. 04/30/17   Tommi Rumps, PA-C  trimethoprim-polymyxin b (POLYTRIM) ophthalmic solution Place 1 drop into the right eye every 4 (four) hours for 7 days. 12/17/17 12/24/17  Orvil Feil, PA-C    Allergies Patient has no known allergies.  Family History  Problem Relation Age of Onset  . Pancreatic cancer  Maternal Grandmother   . Diabetes Maternal Grandfather     Social History Social History   Tobacco Use  . Smoking status: Current Every Day Smoker    Packs/day: 0.50    Types: Cigarettes  . Smokeless tobacco: Never Used  . Tobacco comment: 1/2 ppd  Substance Use Topics  . Alcohol use: No  . Drug use: No     Review of Systems  Constitutional: No fever/chills Eyes: Patient has right eye conjunctivitis. No discharge ENT: No upper respiratory complaints. Cardiovascular: no chest pain. Respiratory: no cough. No SOB. Gastrointestinal: No abdominal pain.  No nausea, no vomiting.  No diarrhea.  No constipation. Genitourinary: Negative for dysuria. No hematuria Musculoskeletal: Negative for musculoskeletal pain. Skin: Negative for rash, abrasions, lacerations, ecchymosis. Neurological: Negative for headaches, focal weakness or numbness.   ____________________________________________   PHYSICAL EXAM:  VITAL SIGNS: ED Triage Vitals  Enc Vitals Group     BP 12/17/17 2204 131/76     Pulse Rate 12/17/17 2204 99     Resp 12/17/17 2204 20     Temp 12/17/17 2206 98.8 F (37.1 C)     Temp Source 12/17/17 2206 Oral     SpO2 12/17/17 2204 97 %     Weight 12/17/17 2205 130 lb (59 kg)     Height 12/17/17 2205 5\' 2"  (1.575 m)     Head Circumference --      Peak Flow --      Pain Score 12/17/17 2205 6     Pain Loc --  Pain Edu? --      Excl. in [place]? --      Constitutional: Alert and oriented. Well appearing and in no acute distress. Eyes: Patient has right eye conjunctivitis with increased tearing and crusting noted at the medial canthus.  PERRL. EOMI. Head: Atraumatic. ENT:      Nose: No congestion/rhinnorhea.      Mouth/Throat: Mucous membranes are moist.  Neck: No stridor.  No cervical spine tenderness to palpation. Cardiovascular: Normal rate, regular rhythm. Normal S1 and S2.  Good peripheral circulation. Respiratory: Normal respiratory effort without tachypnea or  retractions. Lungs CTAB. Good air entry to the bases with no decreased or absent breath sounds. Psychiatric: Mood and affect are normal. Speech and behavior are normal. Patient exhibits appropriate insight and judgement.   ____________________________________________   LABS (all labs ordered are listed, but only abnormal results are displayed)  Labs Reviewed - No data to display ____________________________________________  EKG   ____________________________________________  RADIOLOGY   No results found.  ____________________________________________    PROCEDURES  Procedure(s) performed:    Procedures    Medications  trimethoprim-polymyxin b (POLYTRIM) ophthalmic solution 1 drop (has no administration in time range)  erythromycin 5 MG/GM ophthalmic ointment (has no administration in time range)     ____________________________________________   INITIAL IMPRESSION / ASSESSMENT AND PLAN / ED COURSE  Pertinent labs & imaging results that were available during my care of the patient were reviewed by me and considered in my medical decision making (see chart for details).  Review of the [place] CSRS was performed in accordance of the [facility] prior to dispensing any controlled drugs.      Assessment and plan Conjunctivitis Patient presents to the emergency department with right eye conjunctivitis that started tonight.  Patient was treated empirically with Polytrim ophthalmic solution.  She was advised to follow-up with primary care as needed.  All patient questions were answered.    ____________________________________________  FINAL CLINICAL IMPRESSION(S) / ED DIAGNOSES  Final diagnoses:  Acute bacterial conjunctivitis of right eye      NEW MEDICATIONS STARTED DURING THIS VISIT:  ED Discharge Orders        Ordered    trimethoprim-polymyxin b (POLYTRIM) ophthalmic solution  Every 4 hours     12/17/17 2300          This chart was dictated using voice  recognition software/Dragon. Despite best efforts to proofread, errors can occur which can change the meaning. Any change was purely unintentional.    Orvil FeilWoods, Aayra Hornbaker M, PA-C 12/17/17 2305    Myrna BlazerSchaevitz, David Matthew, MD 12/17/17 [phone]

## 2017-12-17 NOTE — ED Triage Notes (Signed)
Pt reports for the last 4-5 hours has had some redness and burning and drainage from her right eye.

## 2017-12-17 NOTE — ED Notes (Signed)
Pt ambulatory to room 40 without difficulty or distress noted; Pt reports right eye redness, irritation, drainage that began tonight; denies any injury, denies any visual changes

## 2019-09-13 ENCOUNTER — Other Ambulatory Visit: Payer: Self-pay

## 2019-09-13 ENCOUNTER — Emergency Department
Admission: EM | Admit: 2019-09-13 | Discharge: 2019-09-13 | Disposition: A | Discharge status: Home / Self Care | Payer: Medicaid Other | Attending: Emergency Medicine | Admitting: Emergency Medicine

## 2019-09-13 DIAGNOSIS — K529 Noninfective gastroenteritis and colitis, unspecified: Secondary | ICD-10-CM | POA: Insufficient documentation

## 2019-09-13 DIAGNOSIS — R112 Nausea with vomiting, unspecified: Secondary | ICD-10-CM | POA: Insufficient documentation

## 2019-09-13 DIAGNOSIS — F17210 Nicotine dependence, cigarettes, uncomplicated: Secondary | ICD-10-CM | POA: Diagnosis not present

## 2019-09-13 DIAGNOSIS — R197 Diarrhea, unspecified: Secondary | ICD-10-CM | POA: Insufficient documentation

## 2019-09-13 LAB — COMPREHENSIVE METABOLIC PANEL
ALT: 15 U/L (ref 0–44)
AST: 23 U/L (ref 15–41)
Albumin: 5.2 g/dL — ABNORMAL HIGH (ref 3.5–5.0)
Alkaline Phosphatase: 81 U/L (ref 38–126)
Anion gap: 14 (ref 5–15)
BUN: 16 mg/dL (ref 6–20)
CO2: 21 mmol/L — ABNORMAL LOW (ref 22–32)
Calcium: 9.8 mg/dL (ref 8.9–10.3)
Chloride: 103 mmol/L (ref 98–111)
Creatinine, Ser: 0.83 mg/dL (ref 0.44–1.00)
GFR calc Af Amer: >60 mL/min (ref >60)
GFR calc non Af Amer: >60 mL/min (ref >60)
Glucose, Bld: 157 mg/dL — ABNORMAL HIGH (ref 70–99)
Potassium: 3.5 mmol/L (ref 3.5–5.1)
Sodium: 138 mmol/L (ref 135–145)
Total Bilirubin: 0.8 mg/dL (ref 0.3–1.2)
Total Protein: 8.1 g/dL (ref 6.5–8.1)

## 2019-09-13 LAB — CBC
HCT: 46.1 % — ABNORMAL HIGH (ref 36.0–46.0)
Hemoglobin: 16.2 g/dL — ABNORMAL HIGH (ref 12.0–15.0)
MCH: 31.3 pg (ref 26.0–34.0)
MCHC: 35.1 g/dL (ref 30.0–36.0)
MCV: 89.2 fL (ref 80.0–100.0)
Platelets: 185 10*3/uL (ref 150–400)
RBC: 5.17 MIL/uL — ABNORMAL HIGH (ref 3.87–5.11)
RDW: 12.2 % (ref 11.5–15.5)
WBC: 19.3 10*3/uL — ABNORMAL HIGH (ref 4.0–10.5)
nRBC: 0 % (ref 0.0–0.2)

## 2019-09-13 LAB — LIPASE, BLOOD: Lipase: 20 U/L (ref 11–51)

## 2019-09-13 MED ORDER — ONDANSETRON HCL 4 MG/2ML IJ SOLN
4.0000 mg | INTRAMUSCULAR | Status: AC
  Administered 2019-09-13: 07:00:00 4 mg via INTRAVENOUS
  Filled 2019-09-13: qty 2

## 2019-09-13 MED ORDER — SODIUM CHLORIDE 0.9 % IV BOLUS
1000.0000 mL | Freq: Once | INTRAVENOUS | Status: AC
  Administered 2019-09-13: 1000 mL via INTRAVENOUS

## 2019-09-13 MED ORDER — ONDANSETRON 4 MG PO TBDP: 4.0000 mg | ORAL_TABLET | Freq: Three times a day (TID) | ORAL | 0 refills | Status: DC | PRN

## 2019-09-13 MED ORDER — PROMETHAZINE HCL 25 MG/ML IJ SOLN
12.5000 mg | Freq: Once | INTRAMUSCULAR | Status: AC
  Administered 2019-09-13: 09:00:00 12.5 mg via INTRAVENOUS
  Filled 2019-09-13: qty 1

## 2019-09-13 MED ORDER — LACTATED RINGERS IV BOLUS
1000.0000 mL | Freq: Once | INTRAVENOUS | Status: AC
  Administered 2019-09-13: 1000 mL via INTRAVENOUS

## 2019-09-13 NOTE — ED Provider Notes (Signed)
[facility] Emergency Department Provider Note   ____________________________________________   First MD Initiated Contact with Patient 09/13/19 [phone]     (approximate)  I have reviewed the triage vital signs and the nursing notes.   HISTORY  Chief Complaint Emesis and Diarrhea    HPI Dawn Chambers is a 24 y.o. female with no significant past medical history presents to the ED complaining of vomiting and diarrhea.  Patient reports around 12 AM this morning she developed profuse watery diarrhea as well as multiple episodes of nausea and vomiting.  Emesis is nonbilious and nonbloody, she denies any associated abdominal pain.  She has not had any fevers and states she has otherwise been feeling well recently.  She does admit that her grandmother was recently having similar GI symptoms.  She is not sure of her LMP as her periods have been irregular since starting Nexplanon.  She denies any dysuria, hematuria, vaginal bleeding, or vaginal discharge.        Past Medical History:  Diagnosis Date  . Medical history non-contributory     Patient Active Problem List   Diagnosis Date Noted  . Dehydration 03/18/2017  . Norovirus 03/18/2017  . Nausea & vomiting 03/18/2017  . Diarrhea 03/18/2017  . Postpartum care following vaginal delivery 09/07/2016  . Gestational hypertension 09/05/2016  . Labor and delivery, indication for care 07/23/2015    Past Surgical History:  Procedure Laterality Date  . denies sx history    . NO PAST SURGERIES      Prior to Admission medications   Medication Sig Start Date End Date Taking? Authorizing Provider  amoxicillin (AMOXIL) 875 MG tablet Take 1 tablet (875 mg total) 2 (two) times daily by mouth. 04/30/17   Tommi Rumps, PA-C  ibuprofen (ADVIL,MOTRIN) 600 MG tablet Take 1 tablet (600 mg total) every 8 (eight) hours as needed by mouth. 04/30/17   Bridget Hartshorn L, PA-C  ondansetron (ZOFRAN ODT) 4 MG disintegrating  tablet Take 1 tablet (4 mg total) by mouth every 8 (eight) hours as needed for nausea or vomiting. 09/13/19   Chesley Noon, MD    Allergies Patient has no known allergies.  Family History  Problem Relation Age of Onset  . Pancreatic cancer Maternal Grandmother   . Diabetes Maternal Grandfather     Social History Social History   Tobacco Use  . Smoking status: Current Every Day Smoker    Packs/day: 0.50    Types: Cigarettes  . Smokeless tobacco: Never Used  . Tobacco comment: 1/2 ppd  Substance Use Topics  . Alcohol use: No  . Drug use: No    Review of Systems  Constitutional: No fever/chills Eyes: No visual changes. ENT: No sore throat. Cardiovascular: Denies chest pain. Respiratory: Denies shortness of breath. Gastrointestinal: No abdominal pain.  Positive for nausea and vomiting.  Positive for diarrhea.  No constipation. Genitourinary: Negative for dysuria. Musculoskeletal: Negative for back pain. Skin: Negative for rash. Neurological: Negative for headaches, focal weakness or numbness.  ____________________________________________   PHYSICAL EXAM:  VITAL SIGNS: ED Triage Vitals  Enc Vitals Group     BP 09/13/19 0410 125/69     Pulse Rate 09/13/19 0410 68     Resp 09/13/19 0410 15     Temp 09/13/19 0410 98 F (36.7 C)     Temp src --      SpO2 09/13/19 0410 100 %     Weight 09/13/19 0412 120 lb (54.4 kg)     Height  09/13/19 0412 5\' 2"  (1.575 m)     Head Circumference --      Peak Flow --      Pain Score 09/13/19 0412 0     Pain Loc --      Pain Edu? --      Excl. in [place]? --     Constitutional: Alert and oriented. Eyes: Conjunctivae are normal. Head: Atraumatic. Nose: No congestion/rhinnorhea. Mouth/Throat: Mucous membranes are moist. Neck: Normal ROM Cardiovascular: Normal rate, regular rhythm. Grossly normal heart sounds. Respiratory: Normal respiratory effort.  No retractions. Lungs CTAB. Gastrointestinal: Soft and nontender. No  distention. Genitourinary: deferred Musculoskeletal: No lower extremity tenderness nor edema. Neurologic:  Normal speech and language. No gross focal neurologic deficits are appreciated. Skin:  Skin is warm, dry and intact. No rash noted. Psychiatric: Mood and affect are normal. Speech and behavior are normal.  ____________________________________________   LABS (all labs ordered are listed, but only abnormal results are displayed)  Labs Reviewed  COMPREHENSIVE METABOLIC PANEL - Abnormal; Notable for the following components:      Result Value   CO2 21 (*)    Glucose, Bld 157 (*)    Albumin 5.2 (*)    All other components within normal limits  CBC - Abnormal; Notable for the following components:   WBC 19.3 (*)    RBC 5.17 (*)    Hemoglobin 16.2 (*)    HCT 46.1 (*)    All other components within normal limits  LIPASE, BLOOD  URINALYSIS, COMPLETE (UACMP) WITH MICROSCOPIC  POC URINE PREG, ED     PROCEDURES  Procedure(s) performed (including Critical Care):  Procedures   ____________________________________________   INITIAL IMPRESSION / ASSESSMENT AND PLAN / ED COURSE       Previously healthy 24 year old female presents to the ED with acute onset of vomiting and diarrhea since 12:00 this morning.  She denies any abdominal pain and her abdominal exam is reassuring with no tenderness whatsoever.  Lab work significant only for leukocytosis, which I suspect is secondary to a viral gastroenteritis given her reassuring abdominal exam.  She received an IV fluid bolus as well as dose of Zofran, we will p.o. challenge and reassess.  Urine pregnancy and UA are also pending.  Patient feeling better following IV fluids and Zofran, able to tolerate p.o. without difficulty.  Patient was unable to provide urine sample despite repeated encouragement.  At this point, she wishes to be discharged home without urine testing.  I have advised her to perform pregnancy testing at home when she  is able and to have a urinalysis checked if she were to develop urinary symptoms.  She was counseled to return to the ED for new or worsening symptoms, patient agrees with plan.      ____________________________________________   FINAL CLINICAL IMPRESSION(S) / ED DIAGNOSES  Final diagnoses:  Nausea vomiting and diarrhea  Gastroenteritis     ED Discharge Orders         Ordered    ondansetron (ZOFRAN ODT) 4 MG disintegrating tablet  Every 8 hours PRN     09/13/19 1225           Note:  This document was prepared using Dragon voice recognition software and may include unintentional dictation errors.   Blake Divine, MD 09/13/19 1535

## 2019-09-13 NOTE — ED Notes (Signed)
Pt calling out due to wanting ice chips. This RN stated to pt that she should wait to see the MD first. Pt stating that she would just drink water out of the sink.

## 2019-09-13 NOTE — ED Notes (Signed)
Patient given change of underwear, fresh pants, panty liner, glycerin swabs and place in recliner in subwait.

## 2019-09-13 NOTE — ED Triage Notes (Addendum)
Patient coming [facility] for N/V/D for 90 minutes prior to EMS arrival. Patient's grandmother had same a few days ago. Patient given 4 mg IV zofran.

## 2019-11-27 ENCOUNTER — Ambulatory Visit: Payer: Medicaid Other

## 2019-11-27 ENCOUNTER — Ambulatory Visit: Payer: Self-pay

## 2019-12-02 ENCOUNTER — Ambulatory Visit: Payer: Medicaid Other

## 2020-05-25 ENCOUNTER — Encounter: Payer: Self-pay | Admitting: Student

## 2020-05-25 ENCOUNTER — Ambulatory Visit: Payer: Medicaid Other | Admitting: Family Medicine

## 2020-05-25 ENCOUNTER — Other Ambulatory Visit: Payer: Self-pay

## 2020-05-25 VITALS — BP 117/73 | HR 62 | Ht 62.0 in | Wt 120.6 lb

## 2020-05-25 DIAGNOSIS — R519 Headache, unspecified: Secondary | ICD-10-CM

## 2020-05-25 DIAGNOSIS — N9089 Other specified noninflammatory disorders of vulva and perineum: Secondary | ICD-10-CM

## 2020-05-25 DIAGNOSIS — Z3009 Encounter for other general counseling and advice on contraception: Secondary | ICD-10-CM

## 2020-05-25 DIAGNOSIS — Z720 Tobacco use: Secondary | ICD-10-CM

## 2020-05-25 DIAGNOSIS — Z113 Encounter for screening for infections with a predominantly sexual mode of transmission: Secondary | ICD-10-CM

## 2020-05-25 LAB — WET PREP FOR TRICH, YEAST, CLUE
Trichomonas Exam: NEGATIVE
Yeast Exam: NEGATIVE

## 2020-05-25 NOTE — Progress Notes (Cosign Needed)
[facility] DEPARTMENT [facility] [address] Main Number: [phone]    Family Planning Visit- Initial Visit  Subjective:  Dawn Chambers is a 24 y.o.  [id number]   being seen today for an initial well woman visit and to discuss family planning options.  She is currently using Nexplanon for pregnancy prevention. Patient reports she does not want a pregnancy in the next year.  Patient has the following medical conditions has Labor and delivery, indication for care; Gestational hypertension; Postpartum care following vaginal delivery; Dehydration; Norovirus; Nausea & vomiting; and Diarrhea on their problem list.  Chief Complaint  Patient presents with  . Annual Exam    Patient reports here for physical, no other concerns   Patient denies any pa  Body mass index is 22.06 kg/m. - Patient is eligible for diabetes screening based on BMI and age >38?  not applicable HA1C ordered? no  Patient reports 4 of partners in last year. Desires STI screening?  Yes  Has patient been screened once for HCV in the past?  No  No results found for: HCVAB  Does the patient have current drug use (including MJ), have a partner with drug use, and/or has been incarcerated since last result? Yes  If yes-- Screen for HCV through [facility]   Does the patient meet criteria for HBV testing? Yes  Criteria:  -Household, sexual or needle sharing contact with HBV -History of drug use -HIV positive -Those with known Hep C   Health Maintenance Due  Topic Date Due  . Hepatitis C Screening  Never done  . COVID-19 Vaccine (1) Never done  . CHLAMYDIA SCREENING  Never done  . TETANUS/TDAP  Never done  . INFLUENZA VACCINE  01/18/2020    Review of Systems  Neurological: Positive for headaches.  All other systems reviewed and are negative.   The following portions of the patient's history were reviewed and updated as appropriate: allergies, current medications, past  family history, past medical history, past social history, past surgical history and problem list. Problem list updated.   See flowsheet for other program required questions.  Objective:   Vitals:   05/25/20 1058  BP: 117/73  Pulse: 62  Weight: 120 lb 9.6 oz (54.7 kg)  Height: 5\' 2"  (1.575 m)    Physical Exam Vitals and nursing note reviewed.  Constitutional:      Appearance: Normal appearance. She is normal weight.  HENT:     Head: Normocephalic and atraumatic.     Nose:     Comments: Ring through septum     Mouth/Throat:     Mouth: Mucous membranes are moist.     Pharynx: Oropharynx is clear. No oropharyngeal exudate or posterior oropharyngeal erythema.  Eyes:     General: No scleral icterus. Cardiovascular:     Rate and Rhythm: Normal rate and regular rhythm.     Pulses: Normal pulses.     Heart sounds: Normal heart sounds.  Pulmonary:     Effort: Pulmonary effort is normal.     Breath sounds: Normal breath sounds.  Abdominal:     General: Abdomen is flat. Bowel sounds are normal.     Palpations: Abdomen is soft.  Genitourinary:      Comments: External genitalia without, lice, nits, erythema, edema or inguinal adenopathy. Vagina with normal mucosa and discharge and pH equals 4.  Cervix without visual lesions, uterus firm, mobile, non-tender, no masses, CMT adnexal fullness or tenderness.   Musculoskeletal:  General: Normal range of motion.     Cervical back: Normal range of motion and neck supple.  Lymphadenopathy:     Cervical: No cervical adenopathy.  Skin:    General: Skin is warm and dry.     Comments: Tattoo present on hand, right flank, spine   Neurological:     General: No focal deficit present.     Mental Status: She is alert.       Assessment and Plan:  Dawn Chambers is a 24 y.o. female presenting to the [facility] Department for an initial well woman exam/family planning visit  Contraception counseling: Reviewed all forms  of birth control options in the tiered based approach. available including abstinence; over the counter/barrier methods; hormonal contraceptive medication including pill, patch, ring, injection,contraceptive implant, ECP; hormonal and nonhormonal IUDs; permanent sterilization options including vasectomy and the various tubal sterilization modalities. Risks, benefits, and typical effectiveness rates were reviewed.  Questions were answered.  Written information was also given to the patient to review.  Patient has nexplanon and desires to continue. She will follow up in 1 year  for surveillance.  She was told to call with any further questions, or with any concerns about this method of contraception.  Emphasized use of condoms 100% of the time for STI prevention.  Patient not  was offered ECP. ECP was not indicated for this patient   1. Family planning counseling  Patient in clinic for physical exam.   Per patient last pap was in 2019, ROI sent to [facility] for pap records.  Patient has nexplonon for [facility], was due to be replaced at 3 year mark on 11/04/19.  Discussed with patient that nexplanon can be kept for 4 years.   Patient wants to have nexplanon removed and reinserted.  Patient reports appointment scheduled for 06/02/2020.   2. Screening examination for venereal disease  Patient want screening for STI's today.  Patient denies any s/sx.    - HIV/HCV [place] [facility] - HBV Antigen/Antibody [facility] - Virology, [place] [facility] - Chlamydia/Gonorrhea [place] [facility] - Syphilis Serology, [place] [facility] - WET PREP FOR TRICH, YEAST, CLUE  Wet prep negative, no treatment needed.   .Patient accepted all screenings including wet prep,  vaginal CT/GC and bloodwork for HIV,RPR, HCV, and HBV.   Patient meets criteria for HepB screening? Yes. Ordered? Yes Patient meets criteria for HepC screening? Yes. Ordered? Yes   Discussed time line for [facility] results and that patient will be called with positive results and  encouraged patient to call if she had not heard in 2 weeks.  Counseled to return or seek care for continued or worsening symptoms Recommended condom use with all sex  3. Tobacco use Spent 11-12 minutes discussing with patient about tobacco cessation and vaping cessation.  Patient thought that vaping was a better alternative to smoking.  Patient does want to stop smoking.  Discussed with patient a plan to assist in smoking cessation.  Discussed by 12/17 to go from 10 cigs per day to 6 cigs per day.  By 12/24 to go from 6 cigs per day to 3 cigs and to spot smoking by the new year.   Also discussed with patient to use mint flavored tooth picks when having the urge to have the menthol in her mouth.   At the end of discussion patient verbalized that the plan seemed very doable and she was motivated stop.   1-800-quitline card given   Patient does  not plan to stop smoking MJ,  Reports has been smoking since 24 yrs old, no only smokes 1 x per week verses daily.    4. Frequent headaches Patient reports having HA 2-3 times per week, last HA was 3 days ago.   Patient reports light sensitivity, nausea and lightness with having HA.  Sleep, Excedrin migraine or extra strength goody's powder helps with HA.   Discussed with patient to inform PCP, also increase water intake, make sure getting proper nutrition and to cut out red bull drinks.     5.  Vulvar lesion     lesion noted on genital exam, typical of  Syphilis type of lesion.  HSV swab collected to rule out HSV.  sent for testing.  Awaiting results.  Patient also tested for syphilis- awaiting results.     Encouraged no sex until TR are back.  Patient verbalized understanding.    Return in about 1 year (around 05/25/2021) for annual well woman exam, as needed.  No future appointments.  Wendi Snipes, FNP

## 2020-05-25 NOTE — Progress Notes (Signed)
Presents for PE and STD screen. Reports symptoms today. Results reviewed with provider, no treatment indicated per standing order. Sharlyne Pacas, RN

## 2020-05-31 ENCOUNTER — Telehealth: Payer: Self-pay | Admitting: Family Medicine

## 2020-05-31 NOTE — Telephone Encounter (Signed)
Phone call to pt. Pt confirmed password from last visit. Pt counseled that we do not have any new test results available at this point, it can take up to 3 weeks to get all TR back. Pt desired to sign up for My Chart; link was texted to pt's phone.

## 2020-05-31 NOTE — Telephone Encounter (Signed)
Pt is requesting test results.

## 2020-06-01 ENCOUNTER — Telehealth: Payer: Self-pay

## 2020-06-01 LAB — HEPATITIS B SURFACE ANTIGEN: Hepatitis B Surface Ag: NEGATIVE

## 2020-06-01 NOTE — Telephone Encounter (Signed)
TC from [place] ([facility]). Patient RPR is 1:2 and reactive; needs tx.    TC from [place]. Patient scheduled for STI appt 06/02/20 at 1pm. Dawn Campbell, RN

## 2020-06-02 ENCOUNTER — Encounter: Payer: Self-pay | Admitting: Advanced Practice Midwife

## 2020-06-02 ENCOUNTER — Other Ambulatory Visit: Payer: Self-pay

## 2020-06-02 ENCOUNTER — Ambulatory Visit: Payer: Medicaid Other | Admitting: Family Medicine

## 2020-06-02 DIAGNOSIS — Z113 Encounter for screening for infections with a predominantly sexual mode of transmission: Secondary | ICD-10-CM | POA: Diagnosis not present

## 2020-06-02 DIAGNOSIS — A539 Syphilis, unspecified: Secondary | ICD-10-CM | POA: Insufficient documentation

## 2020-06-02 MED ORDER — PENICILLIN G BENZATHINE 1200000 UNIT/2ML IM SUSP
2.4000 10*6.[IU] | Freq: Once | INTRAMUSCULAR | Status: AC
  Administered 2020-06-02: 2.4 10*6.[IU] via INTRAMUSCULAR

## 2020-06-02 NOTE — Progress Notes (Signed)
S: Patient in clinic for treatment for syphilis.  Referred from [facility].    O: patient has a positive RPR 1:2 with reactive trep.    A: Patient was taking ciprofloxacin for ear infection and then started taking mothers amoxicillin because she felt the ear infection did not clear up on the right side.    Patient has chancre on labia at physical on 05/25/20.  See visit note.       P:1. Syphilis  Patient questions answered.  Explained syphilis staging and treatment course.  Pt has NKA.   Redraw RPR today to see if change in titer. Treat for primary syphilis with Bicillin 2.4 million units IM.    Explained No sex for the next 14 days and patient to schedule appointment to RTC for nexplanon removal reinsertion and in 6 months for f/u RPR.    - Syphilis Serology, [place] [facility] - penicillin g benzathine (BICILLIN LA) 1200000 UNIT/2ML injection 2.4 Million Units

## 2020-06-02 NOTE — Progress Notes (Signed)
Patient here for Syphilis Tx appointment. Declines testing at this time.Burt Knack, RN

## 2020-06-02 NOTE — Progress Notes (Signed)
Bicillin administered today. Patient tolerated well and agreed to stay for observation. Patient planning to make appointment for Nexplanon removal/reinsertion today while here. Patient walking and to check back with RN before leaving [facility]. Patient states she scheduled nex remov/reins for next week, and appears well 15 minutes after Bicillin given. Burt Knack, RN

## 2020-06-09 ENCOUNTER — Ambulatory Visit: Payer: Medicaid Other

## 2020-06-09 LAB — HM HIV SCREENING LAB: HM HIV Screening: NEGATIVE

## 2020-06-09 LAB — HM HEPATITIS C SCREENING LAB: HM Hepatitis Screen: NEGATIVE

## 2020-06-16 ENCOUNTER — Ambulatory Visit: Payer: Medicaid Other

## 2020-06-17 ENCOUNTER — Encounter: Payer: Self-pay | Admitting: Physician Assistant

## 2020-06-17 NOTE — Progress Notes (Signed)
ROI was sent to [facility] for patient's pap record.  Response from [facility] is that there is "no pap on file".

## 2020-08-17 LAB — HEPATITIS B SURFACE ANTIGEN: Hepatitis B Surface Ag: NONREACTIVE

## 2021-01-19 ENCOUNTER — Ambulatory Visit: Payer: Medicaid Other

## 2021-01-19 ENCOUNTER — Ambulatory Visit (LOCAL_COMMUNITY_HEALTH_CENTER): Payer: Medicaid Other | Admitting: Advanced Practice Midwife

## 2021-01-19 ENCOUNTER — Other Ambulatory Visit: Payer: Self-pay

## 2021-01-19 VITALS — BP 118/69 | Ht 62.0 in | Wt 119.8 lb

## 2021-01-19 DIAGNOSIS — F17200 Nicotine dependence, unspecified, uncomplicated: Secondary | ICD-10-CM

## 2021-01-19 DIAGNOSIS — Z720 Tobacco use: Secondary | ICD-10-CM

## 2021-01-19 DIAGNOSIS — Z3046 Encounter for surveillance of implantable subdermal contraceptive: Secondary | ICD-10-CM | POA: Diagnosis not present

## 2021-01-19 DIAGNOSIS — F1210 Cannabis abuse, uncomplicated: Secondary | ICD-10-CM

## 2021-01-19 DIAGNOSIS — F1910 Other psychoactive substance abuse, uncomplicated: Secondary | ICD-10-CM | POA: Insufficient documentation

## 2021-01-19 DIAGNOSIS — Z3009 Encounter for other general counseling and advice on contraception: Secondary | ICD-10-CM | POA: Diagnosis not present

## 2021-01-19 MED ORDER — ETONOGESTREL 68 MG ~~LOC~~ IMPL
68.0000 mg | DRUG_IMPLANT | Freq: Once | SUBCUTANEOUS | Status: AC
  Administered 2021-01-19: 68 mg via SUBCUTANEOUS

## 2021-01-19 NOTE — Progress Notes (Signed)
Here today for Nexplanon removal/reinsertion. Last PE here was 05/25/2020. States can't remember last Pap Smear. States Nexplanon placed at [facility] 09/2016. Tawny Hopping, RN

## 2021-01-19 NOTE — Progress Notes (Signed)
Contraception/Family Planning VISIT ENCOUNTER NOTE  Subjective:   Dawn Chambers is a 25 y.o. SWF [id number] (3,4) smoker female here for reproductive life counseling.  Desires Nexplanon removal/reinsertion for  BCM.  Reports she does not want a pregnancy in the next year. Denies abnormal vaginal bleeding, discharge, pelvic pain, problems with intercourse or other gynecologic concerns. Last PE 05/25/20. LMP 12/31/20. Nexplanon inserted 09/2016 [facility]. Last sex 01/15/21 without condom; with current partner x 1 year; 1 partner in last 3 mo. Pt had told us at last PE that last pap was 05/20/18 at [facility]. ROI was obtained and we were told by them "no pap on file". Needs pap 05/2021. 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 Smoking 1/2 ppd. Vapes currently. Daily MJ.  Last ETOH 01/15/21 (2 shots Tequila) 2x/mo.  C/o h/a 2-3x/wk resolved with sleep, always occipital, +N&V, -audio, +light sensitivity. C/o lightheaded x "forever". Pt refuses physical or Hgb today. C/o easily bruised "since I was a kid".    Gynecologic History Patient's last menstrual period was 12/19/2020. Contraception: Nexplanon  Health Maintenance Due  Topic Date Due   COVID-19 Vaccine (1) Never done   HPV VACCINES (1 - 2-dose series) Never done   TETANUS/TDAP  Never done   CHLAMYDIA SCREENING  09/05/2017   Pneumococcal Vaccine 51-22 Years old (2 - PCV) 03/19/2018   INFLUENZA VACCINE  01/17/2021     The following portions of the patient's history were reviewed and updated as appropriate: allergies, current medications, past family history, past medical history, past social history, past surgical history and problem list.  Review of Systems Pertinent items are noted in HPI.   Objective:  BP 118/69   Ht 5\' 2"  (1.575 m)   Wt 119 lb 12.8 oz (54.3 kg)   LMP 12/19/2020 Comment: Spotting  BMI 21.91 kg/m  Gen: well appearing, NAD HEENT: no scleral icterus CV: RR Lung: Normal WOB Ext: warm well perfused     Assessment and Plan:   Contraception  counseling: Reviewed all forms of birth control options in the tiered based approach. available including abstinence; over the counter/barrier methods; hormonal contraceptive medication including pill, patch, ring, injection,contraceptive implant, ECP; hormonal and nonhormonal IUDs; permanent sterilization options including vasectomy and the various tubal sterilization modalities. Risks, benefits, and typical effectiveness rates were reviewed.  Questions were answered.  Written information was also given to the patient to review.  Patient desires Nexplanon removal/reinsertion, this was prescribed for patient. She will follow up in  05/2021 for surveillance.  She was told to call with any further questions, or with any concerns about this method of contraception.  Emphasized use of condoms 100% of the time for STI prevention.  Patient was offered ECP. ECP was not accepted by the patient. ECP counseling was not given - see RN documentation  1. Family planning Pt refuses pap today. Will need 05/2021  2. Encounter for surveillance of implantable subdermal contraceptive Nexplanon Removal and Insertion  Patient identified, informed consent performed, consent signed.   Patient does understand that irregular bleeding is a very common side effect of this medication. She was advised to have backup contraception for one week after replacement of the implant. Patient deemed to meet WHO criteria for being reasonably certain she is not pregnant.  Appropriate time out taken. Nexplanon site identified. Area prepped in usual sterile fashon. 3 ml of 1% lidocaine with epinephrine was used to anesthetize the area at the distal end of the implant. A small stab incision was made right beside the implant on the  distal portion. The Nexplanon rod was grasped using hemostats and removed without difficulty. There was minimal blood loss. There were no complications.   Confirmed correct location of insertion site. The insertion site  was identified 8-10 cm (3-4 inches) from the medial epicondyle of the humerus and 3-5 cm (1.25-2 inches) posterior to (below) the sulcus (groove) between the biceps and triceps muscles of the patient's left arm. New Nexplanon removed from packaging, Device confirmed in needle, then inserted full length of needle and withdrawn per handbook instructions. Nexplanon was able to palpated in the patient's left arm; patient palpated the insert herself.  There was minimal blood loss. Patient insertion site covered with guaze and a pressure bandage to reduce any bruising. The patient tolerated the procedure well and was given post procedure instructions.    Nexplanon:   Counseled patient to take OTC analgesic starting as soon as lidocaine starts to wear off and take regularly for at least 48 hr to decrease discomfort.  Specifically to take with food or milk to decrease stomach upset and for IB 600 mg (3 tablets) every 6 hrs; IB 800 mg (4 tablets) every 8 hrs; or Aleve 2 tablets every 12 hrs.    3. Smoker 1/2 ppd Counseled via 5 A's to stop smoking  4. 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   No follow-ups on file.  Alberteen Spindle, CNM [facility] DEPARTMENT

## 2021-03-02 ENCOUNTER — Emergency Department
Admission: EM | Admit: 2021-03-02 | Discharge: 2021-03-02 | Disposition: A | Discharge status: Home / Self Care | Payer: Medicaid Other | Attending: Emergency Medicine | Admitting: Emergency Medicine

## 2021-03-02 ENCOUNTER — Emergency Department: Payer: Medicaid Other

## 2021-03-02 ENCOUNTER — Other Ambulatory Visit: Payer: Self-pay

## 2021-03-02 DIAGNOSIS — R0789 Other chest pain: Secondary | ICD-10-CM | POA: Diagnosis not present

## 2021-03-02 DIAGNOSIS — I10 Essential (primary) hypertension: Secondary | ICD-10-CM | POA: Insufficient documentation

## 2021-03-02 DIAGNOSIS — Z20822 Contact with and (suspected) exposure to covid-19: Secondary | ICD-10-CM | POA: Diagnosis not present

## 2021-03-02 DIAGNOSIS — R112 Nausea with vomiting, unspecified: Secondary | ICD-10-CM | POA: Insufficient documentation

## 2021-03-02 DIAGNOSIS — F17210 Nicotine dependence, cigarettes, uncomplicated: Secondary | ICD-10-CM | POA: Insufficient documentation

## 2021-03-02 DIAGNOSIS — R1013 Epigastric pain: Secondary | ICD-10-CM | POA: Insufficient documentation

## 2021-03-02 LAB — BASIC METABOLIC PANEL
Anion gap: 10 (ref 5–15)
BUN: 12 mg/dL (ref 6–20)
CO2: 19 mmol/L — ABNORMAL LOW (ref 22–32)
Calcium: 9.2 mg/dL (ref 8.9–10.3)
Chloride: 110 mmol/L (ref 98–111)
Creatinine, Ser: 0.72 mg/dL (ref 0.44–1.00)
GFR, Estimated: >60 mL/min (ref >60)
Glucose, Bld: 107 mg/dL — ABNORMAL HIGH (ref 70–99)
Potassium: 3.7 mmol/L (ref 3.5–5.1)
Sodium: 139 mmol/L (ref 135–145)

## 2021-03-02 LAB — RESP PANEL BY RT-PCR (FLU A&B, COVID) ARPGX2
Influenza A by PCR: NEGATIVE
Influenza B by PCR: NEGATIVE
SARS Coronavirus 2 by RT PCR: NEGATIVE

## 2021-03-02 LAB — CBC
HCT: 39 % (ref 36.0–46.0)
Hemoglobin: 14.3 g/dL (ref 12.0–15.0)
MCH: 32.8 pg (ref 26.0–34.0)
MCHC: 36.7 g/dL — ABNORMAL HIGH (ref 30.0–36.0)
MCV: 89.4 fL (ref 80.0–100.0)
Platelets: 181 10*3/uL (ref 150–400)
RBC: 4.36 MIL/uL (ref 3.87–5.11)
RDW: 11.6 % (ref 11.5–15.5)
WBC: 13.4 10*3/uL — ABNORMAL HIGH (ref 4.0–10.5)
nRBC: 0 % (ref 0.0–0.2)

## 2021-03-02 LAB — TROPONIN I (HIGH SENSITIVITY)
Troponin I (High Sensitivity): 3 ng/L (ref <18)
Troponin I (High Sensitivity): <2 ng/L (ref <18)

## 2021-03-02 LAB — HEPATIC FUNCTION PANEL
ALT: 11 U/L (ref 0–44)
AST: 17 U/L (ref 15–41)
Albumin: 4.5 g/dL (ref 3.5–5.0)
Alkaline Phosphatase: 52 U/L (ref 38–126)
Bilirubin, Direct: <0.1 mg/dL (ref 0.0–0.2)
Total Bilirubin: 0.8 mg/dL (ref 0.3–1.2)
Total Protein: 7 g/dL (ref 6.5–8.1)

## 2021-03-02 MED ORDER — DOXYCYCLINE HYCLATE 50 MG PO CAPS: 100.0000 mg | ORAL_CAPSULE | Freq: Two times a day (BID) | ORAL | 0 refills | Status: AC

## 2021-03-02 MED ORDER — ONDANSETRON HCL 4 MG/2ML IJ SOLN
4.0000 mg | Freq: Once | INTRAMUSCULAR | Status: AC
  Administered 2021-03-02: 4 mg via INTRAVENOUS
  Filled 2021-03-02: qty 2

## 2021-03-02 MED ORDER — ONDANSETRON 4 MG PO TBDP: 4.0000 mg | ORAL_TABLET | Freq: Three times a day (TID) | ORAL | 0 refills | Status: DC | PRN

## 2021-03-02 MED ORDER — ONDANSETRON HCL 4 MG/2ML IJ SOLN: 4.0000 mg | Freq: Once | INTRAMUSCULAR | Status: AC

## 2021-03-02 MED ORDER — ONDANSETRON HCL 4 MG/2ML IJ SOLN
INTRAMUSCULAR | Status: AC
  Administered 2021-03-02: 4 mg via INTRAVENOUS
  Filled 2021-03-02: qty 2

## 2021-03-02 MED ORDER — SODIUM CHLORIDE 0.9 % IV BOLUS
1000.0000 mL | Freq: Once | INTRAVENOUS | Status: AC
  Administered 2021-03-02: 1000 mL via INTRAVENOUS

## 2021-03-02 NOTE — ED Notes (Signed)
Pt resting on side with blanket.

## 2021-03-02 NOTE — ED Notes (Signed)
Pt falling asleep while this nurse talking to her to explain plan of care. Provided blanket. Pt resting with eyes closed laying on side. Lights dimmed.

## 2021-03-02 NOTE — ED Triage Notes (Signed)
Pt to ED from home [facility] C/o chest pain, n/v Pt takes suboxone daily, did not have today  EMS meds: 324 ASA 2 SL NTG 4mg  zofran IV 20# L AC  EMS VS: 109/64, HR 48-66 SB on 12 lead, 12 lead unremarkable  Pt appears diaphoretic. Pt states feels restless and cannot keep legs still. Denies drug use, alcohol use.  States CP is sharp and stabbing with inhalation

## 2021-03-02 NOTE — ED Provider Notes (Signed)
[facility] Emergency Department Provider Note   ____________________________________________   Event Date/Time   First MD Initiated Contact with Patient 03/02/21 0801     (approximate)  I have reviewed the triage vital signs and the nursing notes.   HISTORY  Chief Complaint Chest Pain and Nausea    HPI Dawn Chambers is a 25 y.o. female who presents for nausea/vomiting via EMS  LOCATION: Mid epigastric abdomen DURATION: 4 hours prior to arrival TIMING: Stable since onset SEVERITY: Moderate QUALITY: Nausea/vomiting CONTEXT: Patient states that she woke up today with nausea/vomiting and as well as pain in the midepigastric region that radiates up into her chest MODIFYING FACTORS: P.o. 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 Authorizing Provider  ondansetron (ZOFRAN ODT) 4 MG disintegrating tablet Take 1 tablet (4 mg total) by mouth every 8 (eight) hours as needed for nausea or vomiting. 03/02/21  Yes Merwyn Katos, MD  amoxicillin (AMOXIL) 875 MG tablet Take 1  tablet (875 mg total) 2 (two) times daily by mouth. Patient not taking: Reported on 01/19/2021 04/30/17   Tommi Rumps, PA-C  buprenorphine-naloxone (SUBOXONE) 8-2 mg SUBL SL tablet Place 1 tablet under the tongue daily.    [provider]  ibuprofen (ADVIL,MOTRIN) 600 MG tablet Take 1 tablet (600 mg total) every 8 (eight) hours as needed by mouth. Patient not taking: Reported on 05/25/2020 04/30/17   Bridget Hartshorn L, PA-C  ondansetron (ZOFRAN ODT) 4 MG disintegrating tablet Take 1 tablet (4 mg total) by mouth every 8 (eight) hours as needed for nausea or vomiting. Patient not taking: Reported on 05/25/2020 09/13/19   Chesley Noon, MD    Allergies Patient has no known allergies.  Family History  Problem Relation Age of Onset   Pancreatic cancer Maternal Grandmother    Diabetes Maternal Grandmother    Diabetes Maternal Grandfather     Social History Social History   Tobacco Use   Smoking status: Every Day    Packs/day: 0.50    Years: 8.00    Pack years: 4.00    Types: Cigarettes   Smokeless tobacco: Never   Tobacco comments:    1/2 ppd  Vaping Use   Vaping Use: Every day   Substances: Nicotine, Flavoring  Substance Use Topics   Alcohol use: Yes    Comment: "mixed drinks"   Drug use: Yes    Types: Marijuana    Review of Systems Constitutional: No fever/chills Eyes: No visual changes.  ENT: No sore throat. Cardiovascular: Endorses burning chest pain. Respiratory: Denies shortness of breath. Gastrointestinal: Endorses midepigastric abdominal pain, nausea, and vomiting.  No diarrhea. Genitourinary: Negative for dysuria. Musculoskeletal: Negative for acute arthralgias Skin: Negative for rash. Neurological: Negative for headaches, weakness/numbness/paresthesias in any extremity Psychiatric: Negative for suicidal ideation/homicidal ideation   ____________________________________________   PHYSICAL EXAM:  VITAL SIGNS: ED Triage Vitals  Enc Vitals  Group     BP 03/02/21 0803 113/69     Pulse Rate 03/02/21 0803 (!) 54     Resp 03/02/21 0803 16     Temp 03/02/21 0803 (!) 97.5 F (36.4 C)     Temp Source 03/02/21 0803 Oral     SpO2 03/02/21 0803 100 %     Weight 03/02/21 0804 115 lb (52.2 kg)     Height 03/02/21 0804 5\' 2"  (1.575 m)     Head Circumference --      Peak Flow --      Pain Score 03/02/21 0803 8     Pain Loc --      Pain Edu? --      Excl. in [place]? --    Constitutional: Alert and oriented. Well appearing Caucasian female in no acute distress. Eyes: Conjunctivae are normal. PERRL. Head: Atraumatic. Nose: No congestion/rhinnorhea. Mouth/Throat: Mucous membranes are moist. Neck: No stridor Cardiovascular: Grossly normal heart sounds.  Good peripheral circulation. Respiratory: Normal respiratory effort.  No retractions. Gastrointestinal: Soft and nontender. No distention. Musculoskeletal: No obvious deformities Neurologic:  Normal speech and language. No gross focal neurologic deficits are appreciated. Skin:  Skin is warm and dry. No rash noted. Psychiatric: Mood and affect are normal. Speech and behavior are normal.  ____________________________________________   LABS (all labs ordered are listed, but only abnormal results are displayed)  Labs Reviewed  BASIC METABOLIC PANEL - Abnormal; Notable for the following components:      Result Value   CO2 19 (*)    Glucose, Bld 107 (*)    All other components within normal limits  CBC - Abnormal; Notable for the following components:   WBC 13.4 (*)    MCHC 36.7 (*)    All other components within normal limits  RESP PANEL BY RT-PCR (FLU A&B, COVID) ARPGX2  HEPATIC FUNCTION PANEL  TROPONIN I (HIGH SENSITIVITY)  TROPONIN I (HIGH SENSITIVITY)   ____________________________________________  EKG  ED ECG REPORT I, 03/04/21, the attending physician, personally viewed and interpreted this ECG.  Date: 03/02/2021 EKG Time: 0804 Rate: 52 Rhythm: Bradycardic  sinus rhythm QRS Axis: normal Intervals: normal ST/T Wave abnormalities: normal Narrative Interpretation: Bradycardic sinus rhythm.  No evidence of acute ischemia  ____________________________________________  RADIOLOGY  ED MD interpretation: 2 view chest x-ray shows no evidence of acute abnormalities including no pneumonia, pneumothorax, or widened mediastinum  Official radiology report(s): DG Chest 2 View  Result Date: 03/02/2021 CLINICAL DATA:  ACS.  Sharp stabbing chest pain EXAM: CHEST - 2 VIEW COMPARISON:  KUB and CT abdomen pelvis, 07/22/2007. FINDINGS: Cardiomediastinal silhouette is within normal limits. Lungs are well inflated. No focal consolidation or mass. No pleural effusion or pneumothorax. No acute osseous abnormality. IMPRESSION: Normal chest Electronically Signed   By: 09/19/2007 M.D.   On: 03/02/2021 08:45    ____________________________________________   PROCEDURES  Procedure(s) performed (including Critical Care):  Procedures   ____________________________________________   INITIAL IMPRESSION / ASSESSMENT AND PLAN / ED COURSE  As part of my medical decision making, I reviewed the following data within the  electronic medical record, if available:  Nursing notes reviewed and incorporated, Labs reviewed, EKG interpreted, Old chart reviewed, Radiograph reviewed and Notes from prior ED visits reviewed and incorporated        Patient presents for acute nausea/vomiting The cause of the patients symptoms is not clear, but the patient is overall well appearing and is suspected to have a transient course of illness.  Given History and Exam there does not appear to be an emergent cause of the symptoms such as small bowel obstruction, coronary syndrome, bowel ischemia, DKA, pancreatitis, appendicitis, other acute abdomen or other emergent problem.  Reassessment: After treatment, the patient is feeling much better, tolerating PO fluids, and shows no signs of  dehydration.   Disposition: Discharge home with prompt primary care physician follow up in the next 48 hours. Strict return precautions discussed.      ____________________________________________   FINAL CLINICAL IMPRESSION(S) / ED DIAGNOSES  Final diagnoses:  Non-intractable vomiting with nausea, unspecified vomiting type  Atypical chest pain     ED Discharge Orders          Ordered    ondansetron (ZOFRAN ODT) 4 MG disintegrating tablet  Every 8 hours PRN        03/02/21 1127             Note:  This document was prepared using Dragon voice recognition software and may include unintentional dictation errors.    Merwyn Katos, MD 03/02/21 [phone]

## 2021-09-27 ENCOUNTER — Other Ambulatory Visit: Payer: Self-pay

## 2021-09-27 ENCOUNTER — Emergency Department
Admission: EM | Admit: 2021-09-27 | Discharge: 2021-09-27 | Disposition: A | Discharge status: Home / Self Care | Payer: Medicaid Other | Attending: Emergency Medicine | Admitting: Emergency Medicine

## 2021-09-27 ENCOUNTER — Emergency Department: Payer: Medicaid Other

## 2021-09-27 DIAGNOSIS — M25642 Stiffness of left hand, not elsewhere classified: Secondary | ICD-10-CM | POA: Diagnosis present

## 2021-09-27 DIAGNOSIS — M25542 Pain in joints of left hand: Secondary | ICD-10-CM | POA: Insufficient documentation

## 2021-09-27 DIAGNOSIS — M25541 Pain in joints of right hand: Secondary | ICD-10-CM | POA: Diagnosis not present

## 2021-09-27 NOTE — ED Provider Notes (Signed)
? ?[facility] ?Provider Note ? ? ? Event Date/Time  ? First MD Initiated Contact with Patient 09/27/21 [phone]   ?  (approximate) ? ? ?History  ? ?hand stiffness ? ? ?HPI ? ?Dawn Chambers is a 26 y.o. female who comes in with concerns for hand stiffness.  Patient reports that since jan she has had bilateral hand stiffness is worse in the morning.  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  She reports that she was previously prescribed some creams and steroids but never took the steroids and the cream did not really help.  She started seeing a new doctor  recently who wanted her to get a rheumatology work-up including blood work and x-rays.  However they are not able to do the x-rays at her doctor's office so they sent them here to get the x-rays.  She reports that she is going back later this afternoon for blood work.  She denies any other concerns.  I did review the patient's paperwork that stated that it was ordered some x-rays and I asked her if she was meant to go to outpatient x-ray or come to the ER.  She states that she is already been through this with registration and that she needs to come to the ER to get the x-rays.  Patient reports that she is got the Nexplanon implant denies any concerns for pregnancy.  Her doctor recently started her on naproxen to help with joint pain. ? ?On review of records patient was seen on 06/02/2020 and treated for syphilis ? ? ?Physical Exam  ? ?Triage Vital Signs: ?ED Triage Vitals  ?Enc Vitals Group  ?   BP 09/27/21 0830 119/75  ?   Pulse Rate 09/27/21 0830 90  ?   Resp 09/27/21 0830 18  ?   Temp 09/27/21 0830 98.1 ?F (36.7 ?C)  ?   Temp Source 09/27/21 0830 Oral  ?   SpO2 09/27/21 0830 94 %  ?   Weight 09/27/21 0822 114 lb 10.2 oz (52 kg)  ?   Height 09/27/21 0822 5\' 2"  (1.575 m)  ?   Head Circumference --   ?   Peak Flow --   ?   Pain Score 09/27/21 0821 8  ?   Pain Loc --   ?   Pain Edu? --   ?   Excl. in [place]? --   ? ? ?Most  recent vital signs: ?Vitals:  ? 09/27/21 0830  ?BP: 119/75  ?Pulse: 90  ?Resp: 18  ?Temp: 98.1 ?F (36.7 ?C)  ?SpO2: 94%  ? ? ? ?General: Awake, no distress.  ?CV:  Good peripheral perfusion.  ?Resp:  Normal effort.  ?Abd:  No distention.  ?Other:  Able to fully close both fist without any obvious swelling or redness noted.  2+ distal pulses ? ? ?ED Results / Procedures / Treatments  ? ? ? ?RADIOLOGY ?I have reviewed the xray personally and no fractures noted ? ? ?IMPRESSION / MDM / ASSESSMENT AND PLAN / ED COURSE  ?I reviewed the triage vital signs and the nursing notes. ?             ?               ? ?Patient comes in for joint pain that could be related to overuse versus early onset arthritis.  Patient has no obvious swelling at this time but does report stiff joints and discomfort  with trying to move them.  No evidence of septic joint on examination.  X-rays ordered per her primary care doctor request without evidence of bony erosion.  Patient declined pregnancy test given patient has implant. ? ?Patient is going to go to her primary care doctor to get blood work later this afternoon.  Of also given her rheumatology's numbers to help get follow-up.  She expressed understanding felt comfortable with discharge home ? ? ?I discussed the provisional nature of ED diagnosis, the treatment so far, the ongoing plan of care, follow up appointments and return precautions with the patient and any family or support people present. They expressed understanding and agreed with the plan, discharged home. ? ? ? ?FINAL CLINICAL IMPRESSION(S) / ED DIAGNOSES  ? ?Final diagnoses:  ?Joint pain in both hands  ? ? ? ?Rx / DC Orders  ? ?ED Discharge Orders   ? ? None  ? ?  ? ? ? ?Note:  This document was prepared using Dragon voice recognition software and may include unintentional dictation errors. ?  ?Vanessa [place], MD ?09/27/21 [phone] ? ?

## 2021-09-27 NOTE — Discharge Instructions (Addendum)
Xray of right and left hand: ?I have listed some names for some rheumatologist if you want to call the office and try to get a follow-up appointment.  Return to the ER if develop fevers, redness or any other concerns ? ? ?There is no evidence of fracture or dislocation. There is no ?evidence of arthropathy or other focal bone abnormality. Soft ?tissues are unremarkable. ?  ?IMPRESSION: ?Negative. ?

## 2021-09-27 NOTE — ED Triage Notes (Signed)
Pt sent by Leonette Most drew for bilat morning hand stiffness/xrays and eval for RA.  ?

## 2021-09-27 NOTE — ED Notes (Signed)
26 yof with a c/c of bilateral hand pain and occasional contractures to her hands. 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  ?

## 2022-11-02 IMAGING — DX DG HAND COMPLETE 3+V*L*
3 series · 3 of 3 positions shown · non-contrast
Comparison: None.

CLINICAL DATA: Bilateral hand pain.

EXAM:
LEFT HAND - COMPLETE 3+ VIEW

[hand ap]
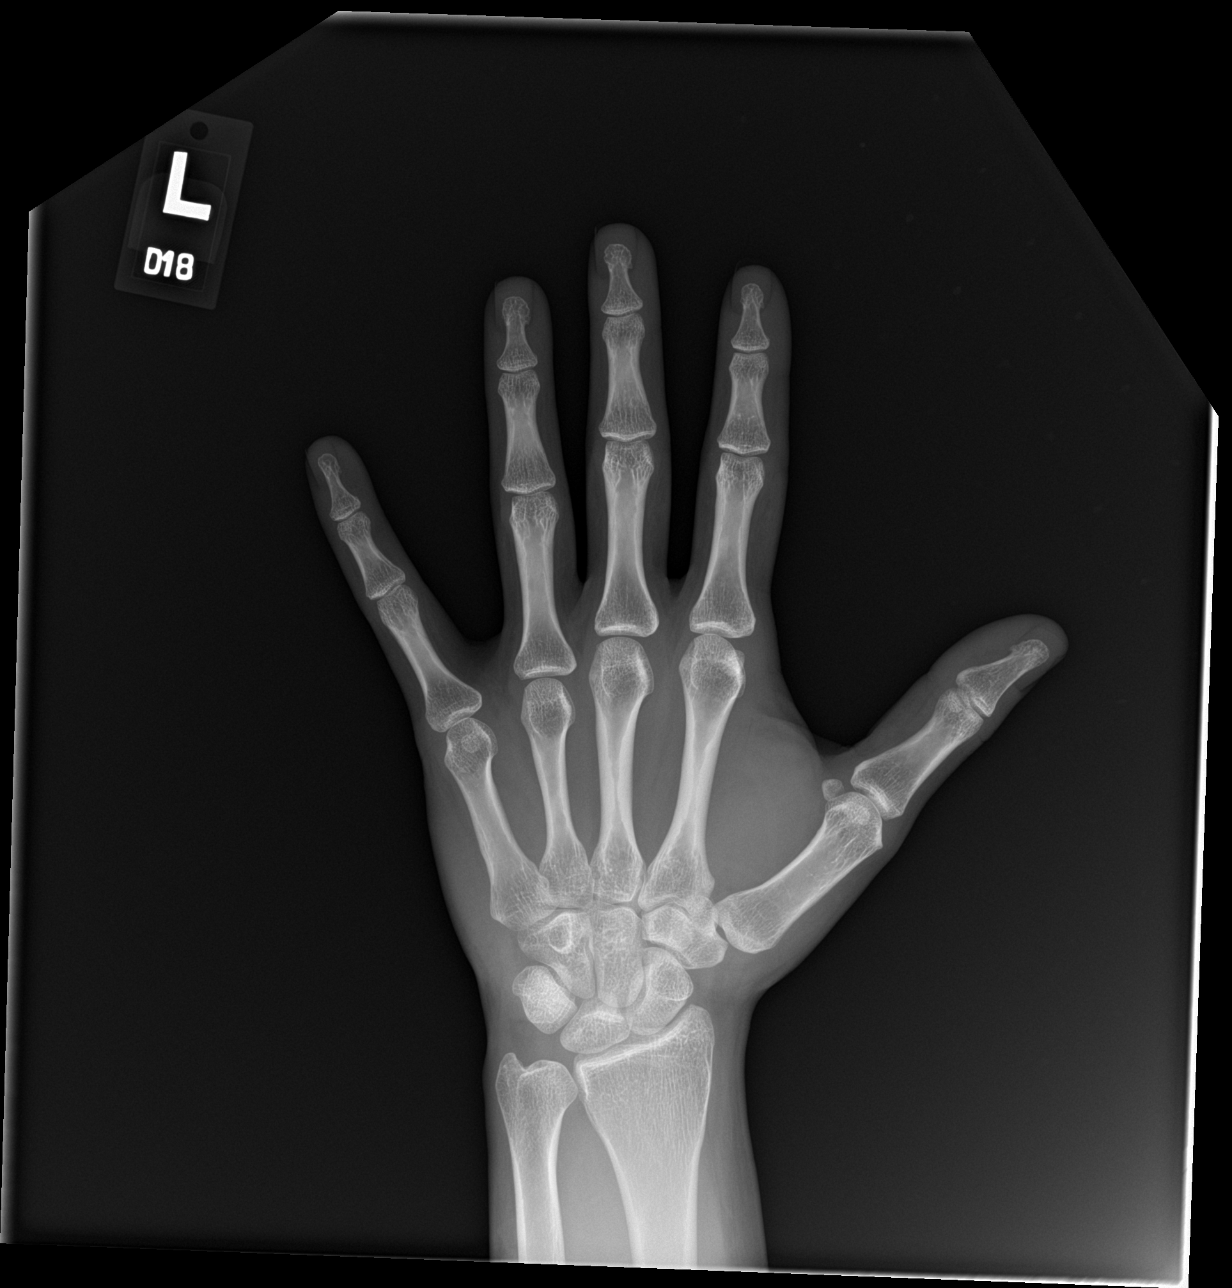

[hand obl]
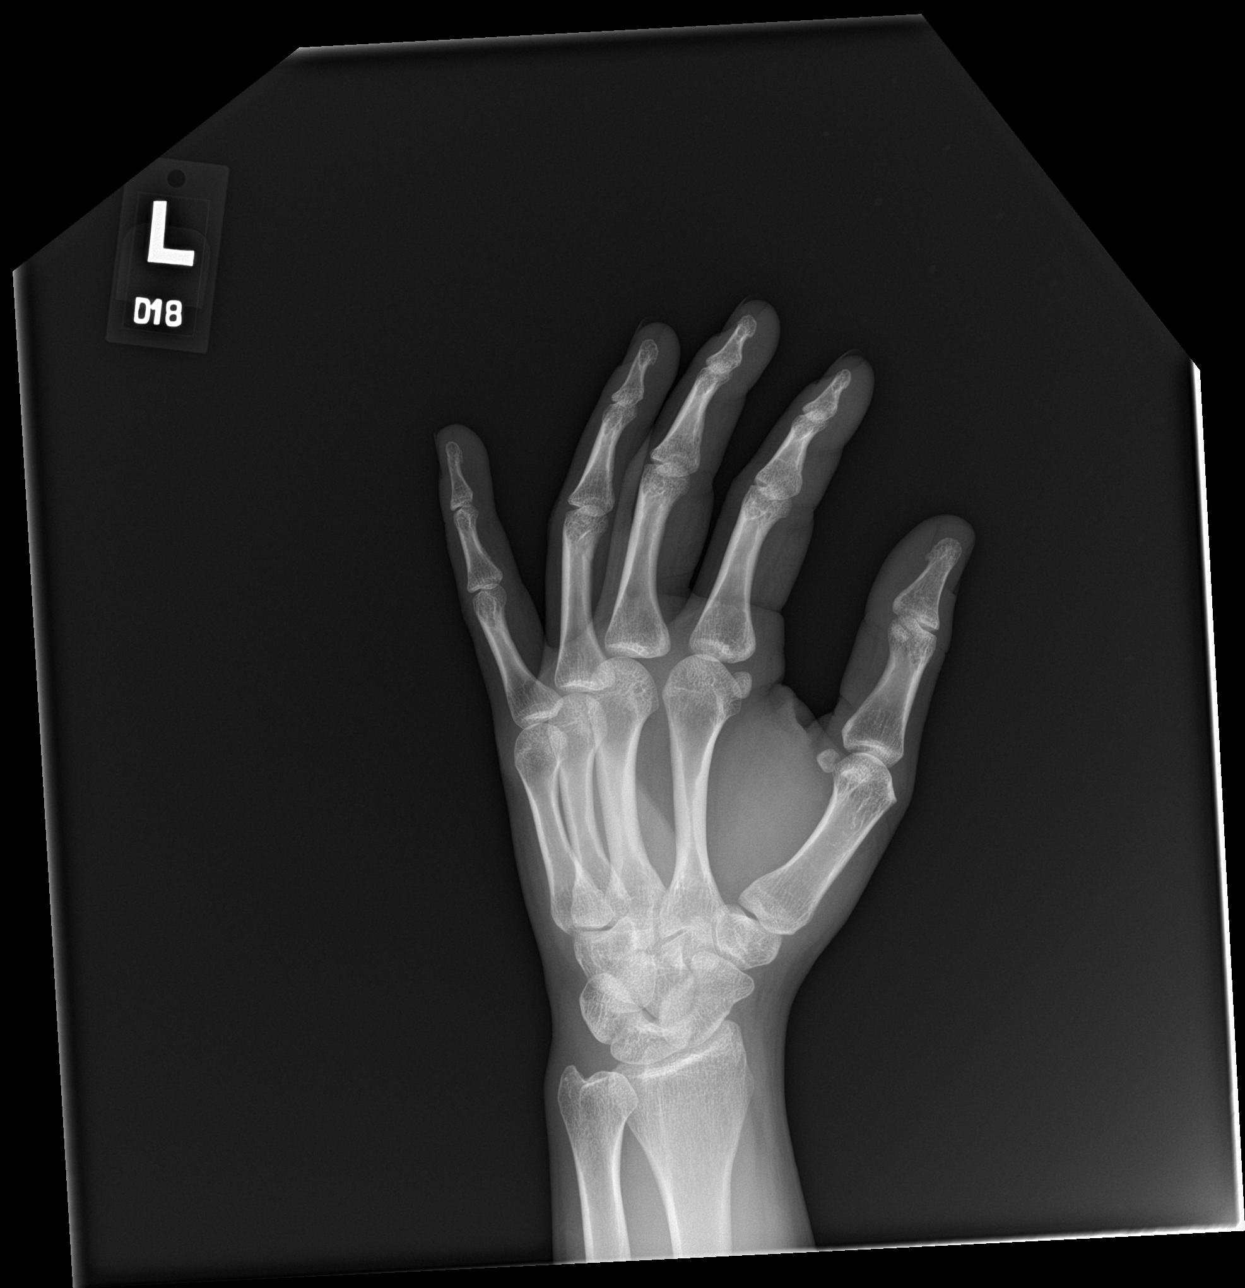

[hand lat]
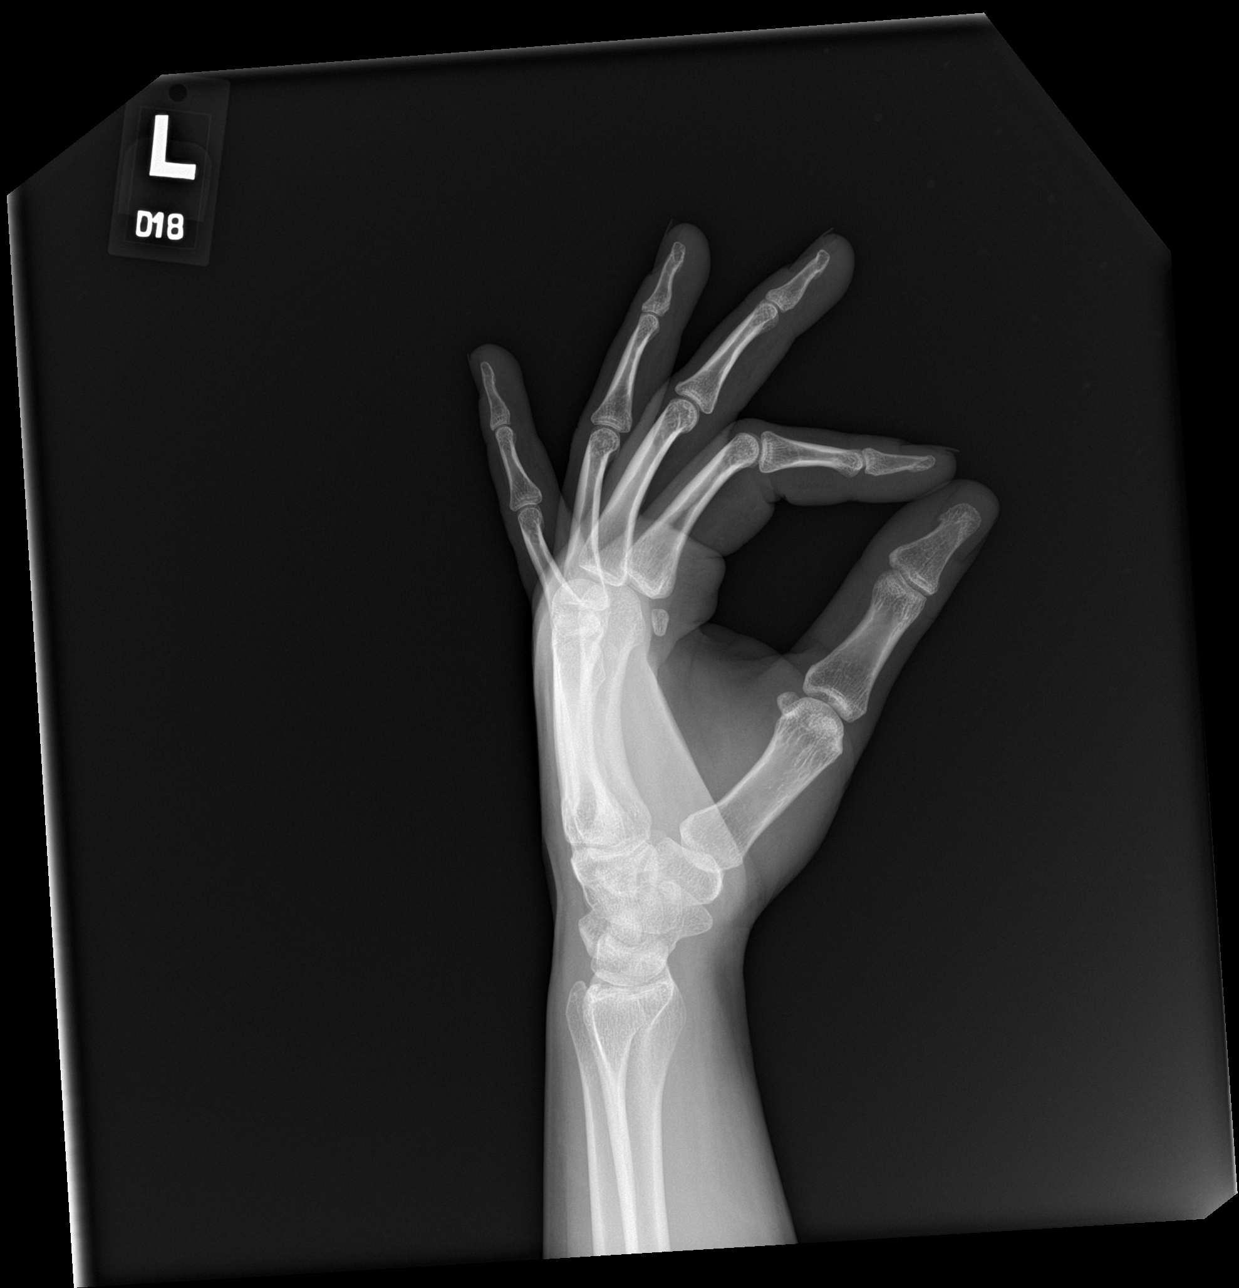

[3 of 3 positions shown; findings below may reference images not displayed]

FINDINGS: There is no evidence of fracture or dislocation. There is no
evidence of arthropathy or other focal bone abnormality. Soft
tissues are unremarkable.
IMPRESSION: Negative.

## 2022-11-02 IMAGING — DX DG HAND COMPLETE 3+V*R*
3 series · 3 of 3 positions shown · non-contrast
Comparison: August 24, 2008.

CLINICAL DATA: Bilateral hand pain.

EXAM:
RIGHT HAND - COMPLETE 3+ VIEW

[hand ap]
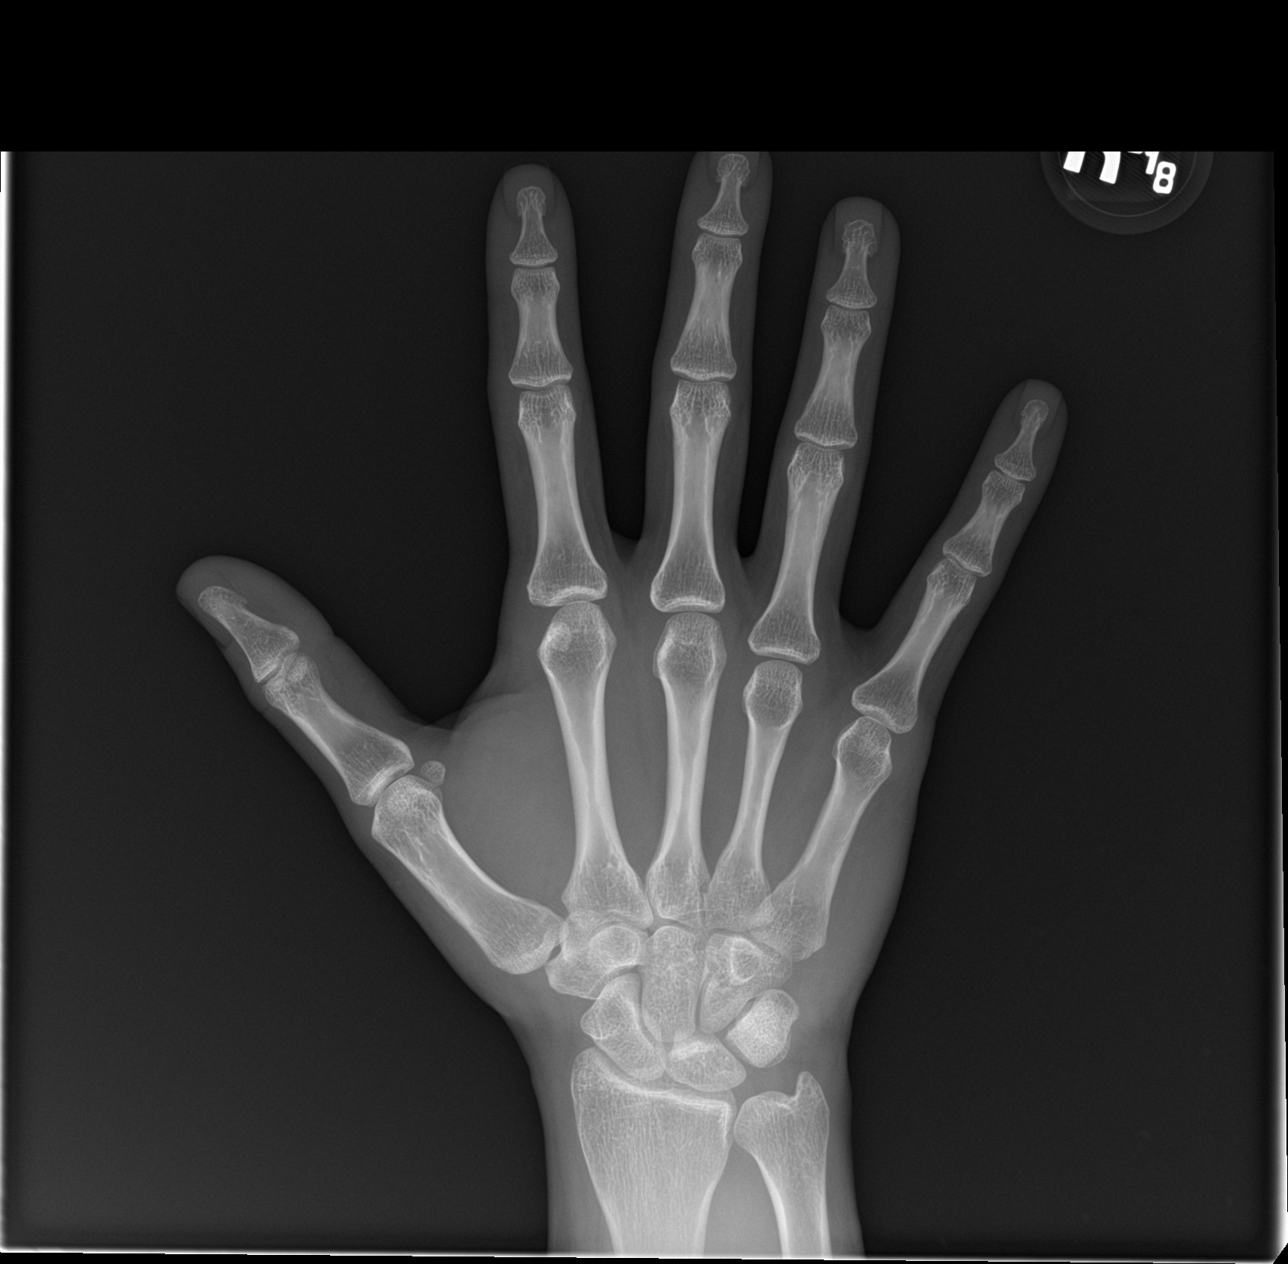

[hand obl]
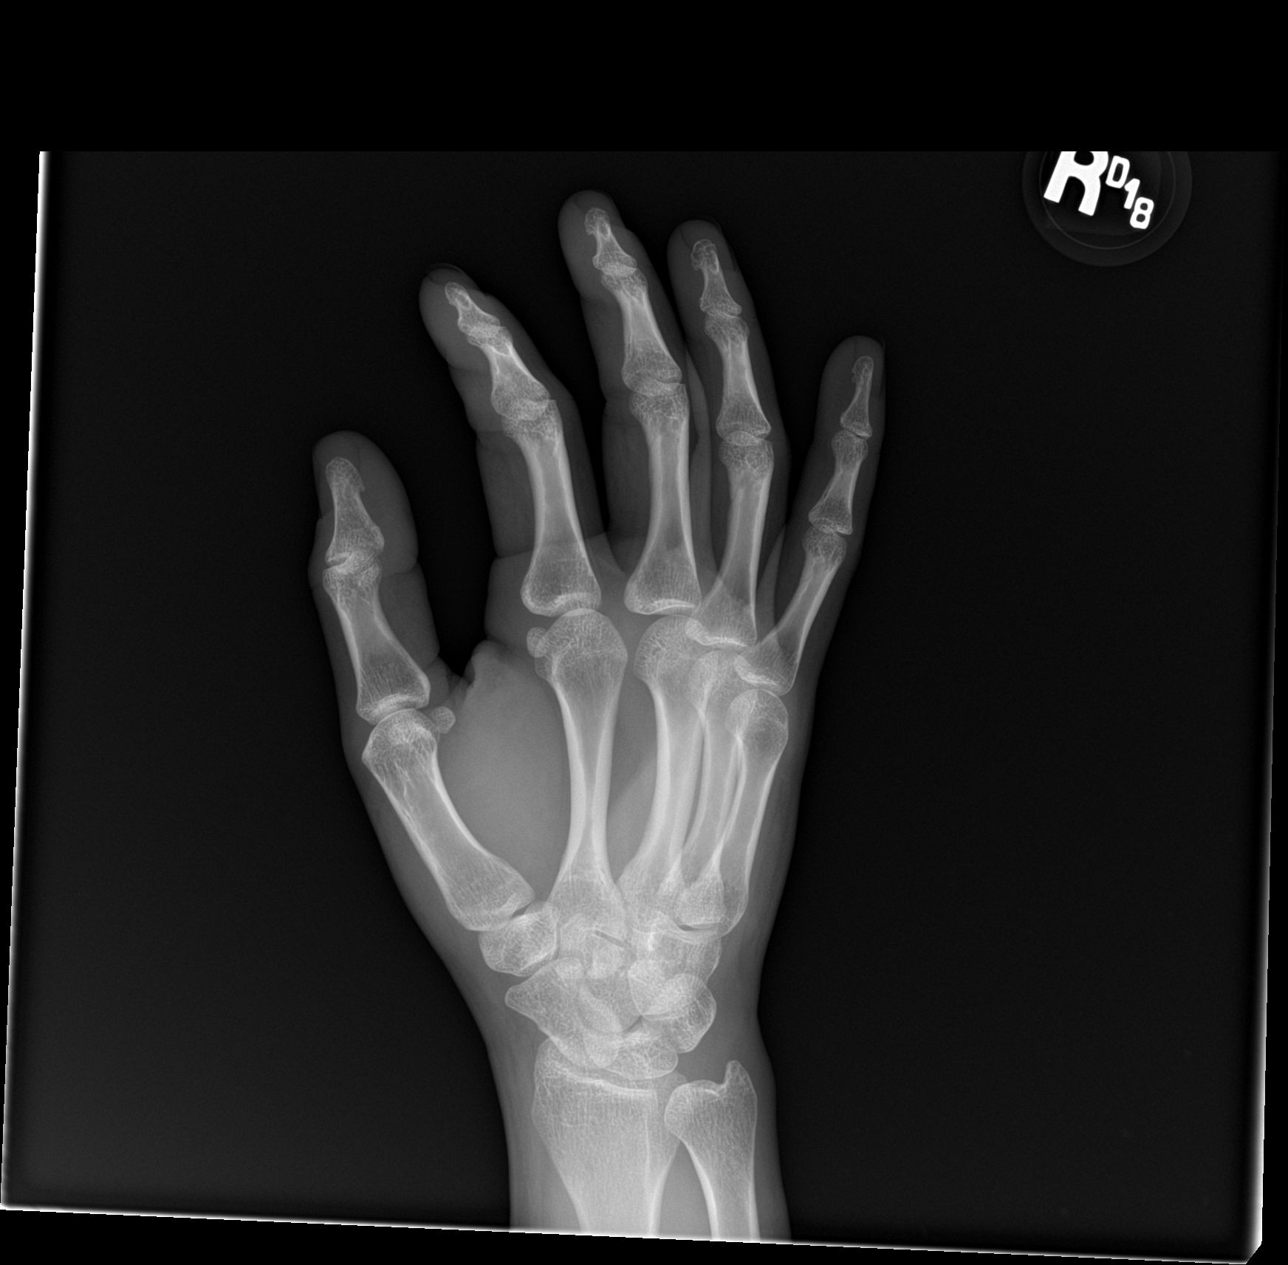

[hand lat]
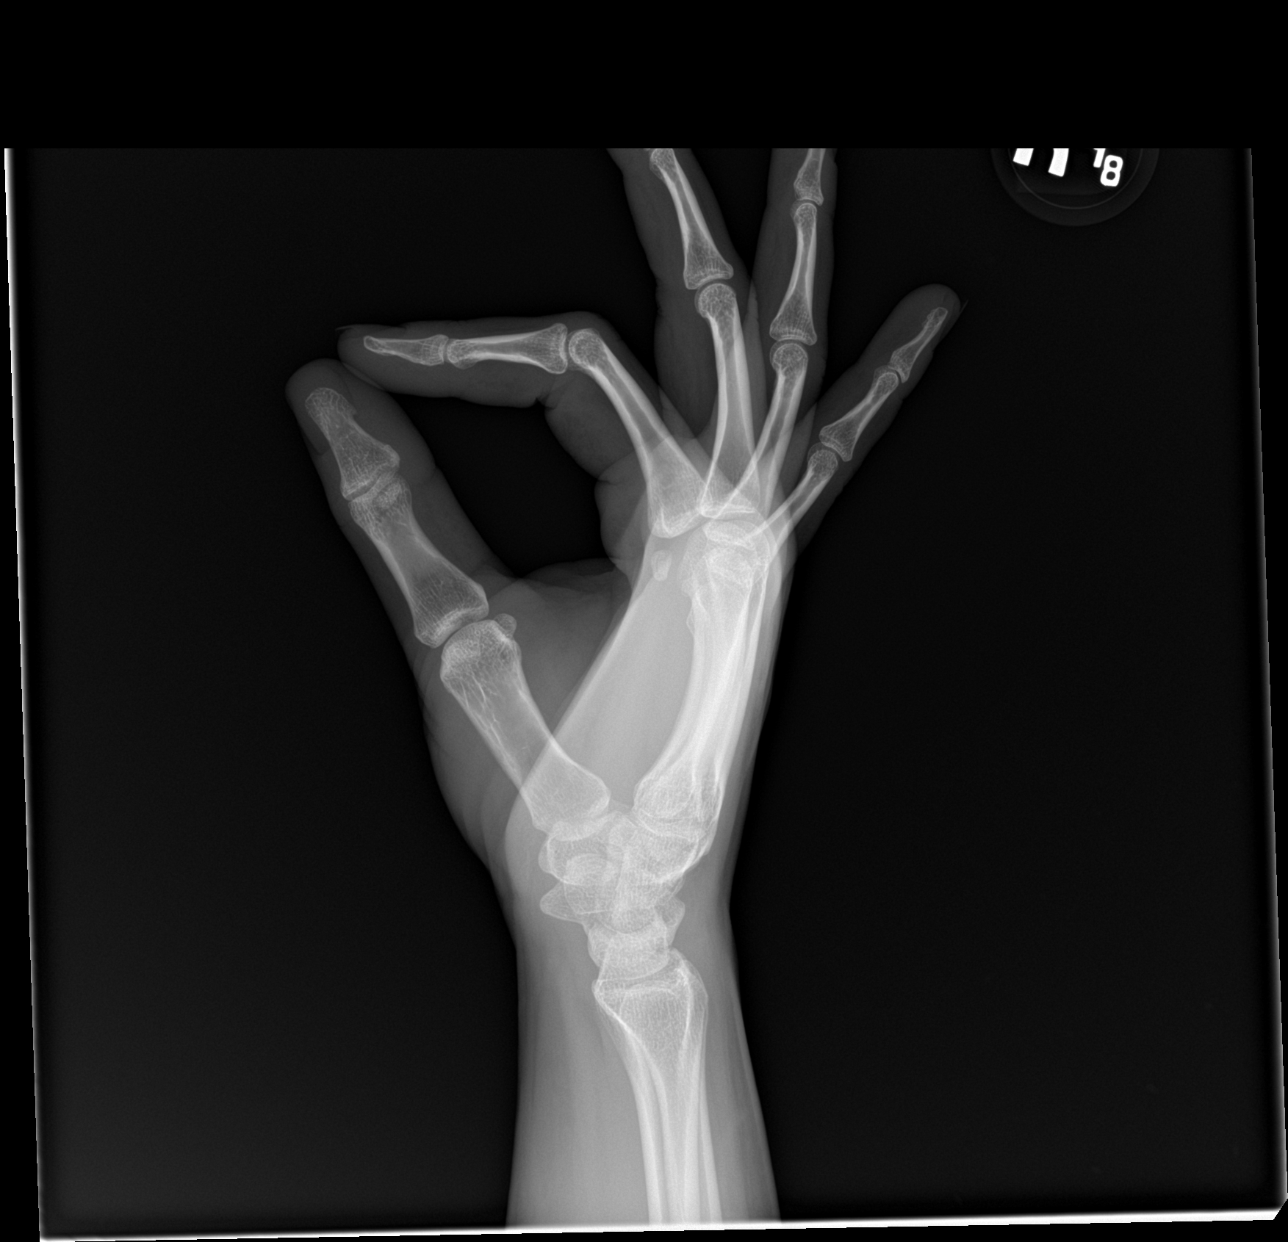

[3 of 3 positions shown; findings below may reference images not displayed]

FINDINGS: There is no evidence of fracture or dislocation. There is no
evidence of arthropathy or other focal bone abnormality. Soft
tissues are unremarkable.
IMPRESSION: Negative.

## 2022-11-10 ENCOUNTER — Encounter: Payer: Self-pay | Admitting: Emergency Medicine

## 2022-11-10 ENCOUNTER — Other Ambulatory Visit: Payer: Self-pay

## 2022-11-10 ENCOUNTER — Emergency Department
Admission: EM | Admit: 2022-11-10 | Discharge: 2022-11-10 | Disposition: A | Discharge status: Home / Self Care | Payer: Medicaid Other | Attending: Emergency Medicine | Admitting: Emergency Medicine

## 2022-11-10 DIAGNOSIS — K0889 Other specified disorders of teeth and supporting structures: Secondary | ICD-10-CM

## 2022-11-10 DIAGNOSIS — K0381 Cracked tooth: Secondary | ICD-10-CM | POA: Insufficient documentation

## 2022-11-10 DIAGNOSIS — I10 Essential (primary) hypertension: Secondary | ICD-10-CM | POA: Insufficient documentation

## 2022-11-10 MED ORDER — AMOXICILLIN 500 MG PO CAPS
500.0000 mg | ORAL_CAPSULE | Freq: Once | ORAL | Status: AC
  Administered 2022-11-10: 500 mg via ORAL
  Filled 2022-11-10: qty 1

## 2022-11-10 MED ORDER — AMOXICILLIN 875 MG PO TABS: 875.0000 mg | ORAL_TABLET | Freq: Two times a day (BID) | ORAL | 0 refills | Status: DC

## 2022-11-10 MED ORDER — LIDOCAINE-EPINEPHRINE 2 %-1:100000 IJ SOLN
1.7000 mL | Freq: Once | INTRAMUSCULAR | Status: AC
  Administered 2022-11-10: 1.7 mL
  Filled 2022-11-10: qty 1.7

## 2022-11-10 NOTE — Discharge Instructions (Addendum)
F/u with your Dentist   If you have been prescribed antibiotics, take them exactly as directed. Do not stop taking them because your symptoms have improved.  Apply ice over the affected area as needed. Take over-the-counter Tylenol or Ibuprofen with food or snacks as needed for pain.Take care and be patient!

## 2022-11-10 NOTE — ED Provider Notes (Signed)
[facility] Emergency Department Provider Note     Event Date/Time   First MD Initiated Contact with Patient 11/10/22 1550     (approximate)   History   Dental Pain   HPI  Dawn Chambers is a 27 y.o. female with a past medical history of hypertension who presents to the emergency department for complaint of dental pain x 1 month.  Patient reports she has a broken tooth that is causing localized pressure-like pain.  She reports pain radiates to the entirety left side of her face.  She reports she has a dentist appointment scheduled for 06/03 to remove broken tooth, but desires something for the pain until then.  He is requesting a note for pain injection.  He has tried Tylenol, ibuprofen and Orajel with no improvement.  Denies fever, chills, illness, vomiting and nausea.  No other complaints at this time.     Physical Exam   Triage Vital Chambers: ED Triage Vitals [11/10/22 1509]  Enc Vitals Group     BP 117/82     Pulse Rate 87     Resp 18     Temp 98.1 F (36.7 C)     Temp Source Oral     SpO2 98 %     Weight      Height      Head Circumference      Peak Flow      Pain Score 8     Pain Loc      Pain Edu?      Excl. in GC?     Most recent vital Chambers: Vitals:   11/10/22 1509  BP: 117/82  Pulse: 87  Resp: 18  Temp: 98.1 F (36.7 C)  SpO2: 98%    General Awake, no distress.  Discomfort HEENT NCAT. PERRL. EOMI. No rhinorrhea. Mucous membranes are moist.  CV:  Good peripheral perfusion.  RESP:  Normal effort.  ABD:  No distention.  Other:  Slight yellow discoloration of taste.  There is a broken tooth of the left second incisor.  Overall poor dentition.  Gums are mildly erythematous and slightly edematous. No bleeding. Tenderness along border of maxillary aspect.   ED Results / Procedures / Treatments   Labs (all labs ordered are listed, but only abnormal results are displayed) Labs Reviewed - No data to  display  PROCEDURES:  Critical Care performed: No  Dental Block  Date/Time: 11/10/2022 8:43 PM  Performed by: Conrad Raymond, PA-C Authorized by: Conrad [place], PA-C   Consent:    Consent obtained:  Verbal   Consent given by:  Patient   Risks, benefits, and alternatives were discussed: yes     Risks discussed:  Infection, swelling and unsuccessful block Indications:    Indications: dental pain   Location:    Block type:  Anterior superior alveolar   Laterality:  Left Procedure details:    Syringe type:  Aspirating dental syringe   Needle gauge:  27 G   Anesthetic injected:  Lidocaine 2% WITH epi   Injection procedure:  Anatomic landmarks identified, anatomic landmarks palpated and introduced needle Post-procedure details:    Outcome:  Pain relieved   Procedure completion:  Tolerated Comments:     Complete resolution immediately post injection.     MEDICATIONS ORDERED IN ED: Medications  lidocaine-EPINEPHrine (XYLOCAINE W/EPI) 2 %-1:100000 (with pres) injection 1.7 mL (1.7 mLs Infiltration Given 11/10/22 1713)  amoxicillin (AMOXIL) capsule 500 mg (500 mg Oral Given 11/10/22  1724)     IMPRESSION / MDM / ASSESSMENT AND PLAN / ED COURSE  I reviewed the triage vital Chambers and the nursing notes.                              Differential diagnosis includes, but is not limited to, dental pain, dental abscess,  Patient's presentation is most consistent with exacerbation of chronic illness.  27 year old female presents to the emergency department for evaluation and treatment of dental pain.  She has a broken tooth of the upper left lateral incisor.  See HPI for further details.  Patient request numbing injection and to be placed on antibiotics.  Given physical exam findings I do believe it is appropriate to provide for patient.  A dental block was performed and tolerated well with immediate resolution of pain. Patient's diagnosis is consistent with dental pain. Patient  will be discharged home with prescriptions for amoxicillin. Patient is to follow up with dentist and attend scheduled appointment on 06/03. Patient is given ED precautions to return to the ED for any worsening or new symptoms.  Patient verbalizes understanding and agrees to assessment and plan.  Clinical Course as of 11/10/22 2050  Fri Nov 10, 2022  1639 06/03 dental appt for broken tooth. Pain for month. Pressure. Desires shot of novacaine. Tylenol/ ibruprofen/ orajel no improvement.  No fever. Refuse pain medicine.  [MH]    Clinical Course User Index [MH] Romeo Apple, Makalah Asberry A, PA-C    FINAL CLINICAL IMPRESSION(S) / ED DIAGNOSES   Final diagnoses:  Pain, dental     Rx / DC Orders   ED Discharge Orders          Ordered    amoxicillin (AMOXIL) 875 MG tablet  2 times daily        11/10/22 1722             Note:  This document was prepared using Dragon voice recognition software and may include unintentional dictation errors.    Romeo Apple, Aquarius Latouche A, PA-C 11/10/22 2050    Chesley Noon, MD 11/13/22 [phone]

## 2022-11-10 NOTE — ED Triage Notes (Signed)
Patient to ED via POV for dental pain. Pain on left upper- broken tooth. Pain ongoing for months but worse today.

## 2023-02-21 ENCOUNTER — Ambulatory Visit: Payer: Medicaid Other

## 2023-03-06 ENCOUNTER — Other Ambulatory Visit: Payer: Self-pay

## 2023-03-06 ENCOUNTER — Emergency Department
Admission: EM | Admit: 2023-03-06 | Discharge: 2023-03-06 | Disposition: A | Discharge status: Home / Self Care | Payer: Medicaid Other | Attending: Emergency Medicine | Admitting: Emergency Medicine

## 2023-03-06 DIAGNOSIS — N764 Abscess of vulva: Secondary | ICD-10-CM | POA: Diagnosis present

## 2023-03-06 LAB — POC URINE PREG, ED: Preg Test, Ur: NEGATIVE

## 2023-03-06 MED ORDER — DOXYCYCLINE HYCLATE 100 MG PO TABS: 100.0000 mg | ORAL_TABLET | Freq: Two times a day (BID) | ORAL | 0 refills | Status: DC

## 2023-03-06 MED ORDER — DOXYCYCLINE HYCLATE 100 MG PO TABS
100.0000 mg | ORAL_TABLET | Freq: Once | ORAL | Status: AC
  Administered 2023-03-06: 100 mg via ORAL
  Filled 2023-03-06: qty 1

## 2023-03-06 NOTE — ED Notes (Signed)
See triage note  Presents with a sore "knot" to left groin area   near her labia  States it has been there for a while but increased in pain yesterday

## 2023-03-06 NOTE — ED Provider Triage Note (Signed)
Emergency Medicine Provider Triage Evaluation Note  Dawn Chambers , a 27 y.o. female  was evaluated in triage.  Pt complains of abscess in her left groin, she does shave in this area.She denies any drainage from the area, very painful.  Review of Systems  Positive: Groin pain Negative: fever  Physical Exam  Ht 5\' 2"  (1.575 m)   Wt 59 kg   LMP 02/11/2023 (Exact Date)   BMI 23.78 kg/m  Gen:   Awake, no distress   Resp:  Normal effort  MSK:   Moves extremities without difficulty  Other:  Red, swollen palpable nodule on the left labia  Medical Decision Making  Medically screening exam initiated at 5:25 PM.  Appropriate orders placed.  Dawn Chambers was informed that the remainder of the evaluation will be completed by another provider, this initial triage assessment does not replace that evaluation, and the importance of remaining in the ED until their evaluation is complete.    Cameron Ali, PA-C 03/06/23 1728

## 2023-03-06 NOTE — Discharge Instructions (Addendum)
Take the antibiotic as directed. Apply warm compress to promote healing. Follow-up with your GYN provider for incision and drainage if lesion comes to a head, or does not resolve.

## 2023-03-06 NOTE — ED Triage Notes (Signed)
Patient states "lump on my panty line"; describes as painful and tender to touch.

## 2023-03-06 NOTE — ED Provider Notes (Signed)
[facility] Emergency Department Provider Note     Event Date/Time   First MD Initiated Contact with Patient 03/06/23 1859     (approximate)   History   Abscess   HPI  Dawn Chambers is a 27 y.o. female presents distal to the ED for evaluation of a "lump" to inguinal canal at her panty line.  Patient denies any spontaneous drainage.  She does endorse having a similar area come up when she was pregnant the last time.  She denies any fevers, chills, vaginal discharge, or abnormal vaginal bleeding.  Physical Exam   Triage Vital Signs: ED Triage Vitals  Encounter Vitals Group     BP 03/06/23 1725 138/84     Systolic BP Percentile --      Diastolic BP Percentile --      Pulse Rate 03/06/23 1725 70     Resp 03/06/23 1725 18     Temp 03/06/23 1725 98.1 F (36.7 C)     Temp Source 03/06/23 1725 Oral     SpO2 03/06/23 1725 98 %     Weight 03/06/23 1723 130 lb (59 kg)     Height 03/06/23 1723 5\' 2"  (1.575 m)     Head Circumference --      Peak Flow --      Pain Score 03/06/23 1723 6     Pain Loc --      Pain Education --      Exclude from Growth Chart --     Most recent vital signs: Vitals:   03/06/23 1725  BP: 138/84  Pulse: 70  Resp: 18  Temp: 98.1 F (36.7 C)  SpO2: 98%    General Awake, no distress. NAD HEENT NCAT. PERRL. EOMI. No rhinorrhea. Mucous membranes are moist.  CV:  Good peripheral perfusion.  RESP:  Normal effort.  ABD:  No distention.  SKIN:  Patient with a 1 cm well formed, tender, cutaneous cystic lesion noted to the inguinal canal on the left side of the groin.  No pointing, fluctuance, spontaneous drainage is appreciated.   ED Results / Procedures / Treatments   Labs (all labs ordered are listed, but only abnormal results are displayed) Labs Reviewed  POC URINE PREG, ED     EKG    RADIOLOGY  No results found.   PROCEDURES:  Critical Care performed: No  Procedures   MEDICATIONS ORDERED IN  ED: Medications  doxycycline (VIBRA-TABS) tablet 100 mg (has no administration in time range)     IMPRESSION / MDM / ASSESSMENT AND PLAN / ED COURSE  I reviewed the triage vital signs and the nursing notes.                              Differential diagnosis includes, but is not limited to, Wirthlin gland abscess, ingrown hair, lymph node, cellulitis, abscess  Patient's presentation is most consistent with acute, uncomplicated illness.  Patient's diagnosis is consistent with likely inguinal abscess versus infected subcutaneous cyst.  We discussed treatment options including I&D procedure.  Patient declined I&D procedure at this time, opting instead for p.o. antibiotic management and warm compresses.  Patient will start empirically on antibiotics, apply warm, steroid to help promote healing.  She is advised to follow-up with his ED or GYN provider for further management and evaluation as discussed.  Patient will be discharged home with prescriptions for doxycycline.   FINAL CLINICAL IMPRESSION(S) / ED  DIAGNOSES   Final diagnoses:  Vulvar abscess     Rx / DC Orders   ED Discharge Orders          Ordered    doxycycline (VIBRA-TABS) 100 MG tablet  2 times daily        03/06/23 2014             Note:  This document was prepared using Dragon voice recognition software and may include unintentional dictation errors.    Lissa Hoard, PA-C 03/07/23 2324    Merwyn Katos, MD 03/09/23 [phone]

## 2023-06-19 ENCOUNTER — Other Ambulatory Visit: Payer: Self-pay

## 2023-06-19 ENCOUNTER — Emergency Department
Admission: EM | Admit: 2023-06-19 | Discharge: 2023-06-19 | Disposition: A | Discharge status: Home / Self Care | Payer: Medicaid Other | Attending: Emergency Medicine | Admitting: Emergency Medicine

## 2023-06-19 ENCOUNTER — Emergency Department: Payer: Medicaid Other

## 2023-06-19 DIAGNOSIS — O3680X0 Pregnancy with inconclusive fetal viability, not applicable or unspecified: Secondary | ICD-10-CM

## 2023-06-19 DIAGNOSIS — O26891 Other specified pregnancy related conditions, first trimester: Secondary | ICD-10-CM | POA: Diagnosis present

## 2023-06-19 DIAGNOSIS — Z3A00 Weeks of gestation of pregnancy not specified: Secondary | ICD-10-CM | POA: Diagnosis not present

## 2023-06-19 DIAGNOSIS — R1030 Lower abdominal pain, unspecified: Secondary | ICD-10-CM

## 2023-06-19 DIAGNOSIS — O219 Vomiting of pregnancy, unspecified: Secondary | ICD-10-CM | POA: Diagnosis not present

## 2023-06-19 LAB — URINALYSIS, ROUTINE W REFLEX MICROSCOPIC
Bacteria, UA: NONE SEEN
Bilirubin Urine: NEGATIVE
Glucose, UA: NEGATIVE mg/dL
Hgb urine dipstick: NEGATIVE
Ketones, ur: NEGATIVE mg/dL
Leukocytes,Ua: NEGATIVE
Nitrite: NEGATIVE
Protein, ur: NEGATIVE mg/dL
Specific Gravity, Urine: 1.018 (ref 1.005–1.030)
pH: 8 (ref 5.0–8.0)

## 2023-06-19 LAB — COMPREHENSIVE METABOLIC PANEL
ALT: 14 U/L (ref 0–44)
AST: 17 U/L (ref 15–41)
Albumin: 5 g/dL (ref 3.5–5.0)
Alkaline Phosphatase: 57 U/L (ref 38–126)
Anion gap: 11 (ref 5–15)
BUN: 14 mg/dL (ref 6–20)
CO2: 25 mmol/L (ref 22–32)
Calcium: 9.5 mg/dL (ref 8.9–10.3)
Chloride: 102 mmol/L (ref 98–111)
Creatinine, Ser: 0.72 mg/dL (ref 0.44–1.00)
GFR, Estimated: >60 mL/min (ref >60)
Glucose, Bld: 109 mg/dL — ABNORMAL HIGH (ref 70–99)
Potassium: 4.6 mmol/L (ref 3.5–5.1)
Sodium: 138 mmol/L (ref 135–145)
Total Bilirubin: 0.7 mg/dL (ref 0.0–1.2)
Total Protein: 8.1 g/dL (ref 6.5–8.1)

## 2023-06-19 LAB — CBC
HCT: 42.8 % (ref 36.0–46.0)
Hemoglobin: 15.2 g/dL — ABNORMAL HIGH (ref 12.0–15.0)
MCH: 31.1 pg (ref 26.0–34.0)
MCHC: 35.5 g/dL (ref 30.0–36.0)
MCV: 87.5 fL (ref 80.0–100.0)
Platelets: 218 10*3/uL (ref 150–400)
RBC: 4.89 MIL/uL (ref 3.87–5.11)
RDW: 12.1 % (ref 11.5–15.5)
WBC: 8.7 10*3/uL (ref 4.0–10.5)
nRBC: 0 % (ref 0.0–0.2)

## 2023-06-19 LAB — LIPASE, BLOOD: Lipase: 47 U/L (ref 11–51)

## 2023-06-19 LAB — POC URINE PREG, ED: Preg Test, Ur: NEGATIVE

## 2023-06-19 LAB — HCG, QUANTITATIVE, PREGNANCY: hCG, Beta Chain, Quant, S: 70 m[IU]/mL — ABNORMAL HIGH (ref <5)

## 2023-06-19 NOTE — Discharge Instructions (Signed)
 You are seen in the emergency department for abdominal pain.  You had a pregnancy test that was positive.  Your ultrasound did not show an intrauterine pregnancy.  You are given information and you should be called for a follow-up to repeat your pregnancy hormone test on Thursday.  Return to the emergency department for worsening abdominal pain or severe vaginal bleeding.  Thank you for choosing us  for your health care, it was my pleasure to care for you today!  Clotilda Punter, MD

## 2023-06-19 NOTE — ED Triage Notes (Signed)
 Pt to ED for lower abd pain started this am. Emesis x2 this am. Brought breakfast to eat.  Reports 7 days late for period

## 2023-06-19 NOTE — ED Provider Notes (Signed)
 [facility] Provider Note    Event Date/Time   First MD Initiated Contact with Patient 06/19/23 1049     (approximate)   History   Abdominal Pain   HPI  Dawn Chambers is a 26 y.o. female G3, P2 who presents to the emergency department with lower abdominal pain.  Started having severe onset of lower abdominal pain earlier today that she would describe as a cramping pain.  2 episodes of vomiting.  States that she is feeling better at this time.  Denies any vaginal bleeding or vaginal discharge.  No concern for an STI.  No fever or chills.  States that she is currently wait for her menstrual cycle.  2 prior live births, 1 prior miscarriage.     Physical Exam   Triage Vital Signs: ED Triage Vitals  Encounter Vitals Group     BP 06/19/23 0711 138/88     Systolic BP Percentile --      Diastolic BP Percentile --      Pulse Rate 06/19/23 0711 93     Resp 06/19/23 0711 16     Temp 06/19/23 0711 98.4 F (36.9 C)     Temp src --      SpO2 06/19/23 0711 100 %     Weight 06/19/23 0710 130 lb (59 kg)     Height 06/19/23 0710 5' 2 (1.575 m)     Head Circumference --      Peak Flow --      Pain Score 06/19/23 0710 7     Pain Loc --      Pain Education --      Exclude from Growth Chart --     Most recent vital signs: Vitals:   06/19/23 0711 06/19/23 1310  BP: 138/88 117/69  Pulse: 93 73  Resp: 16 16  Temp: 98.4 F (36.9 C) 99.5 F (37.5 C)  SpO2: 100% 100%    Physical Exam Constitutional:      Appearance: She is well-developed.  HENT:     Head: Atraumatic.  Eyes:     Conjunctiva/sclera: Conjunctivae normal.  Cardiovascular:     Rate and Rhythm: Regular rhythm.  Pulmonary:     Effort: No respiratory distress.  Abdominal:     General: There is no distension.     Tenderness: There is abdominal tenderness in the suprapubic area.  Musculoskeletal:        General: Normal range of motion.     Cervical back: Normal range of motion.  Skin:     General: Skin is warm.     Capillary Refill: Capillary refill takes less than 2 seconds.  Neurological:     Mental Status: She is alert. Mental status is at baseline.      IMPRESSION / MDM / ASSESSMENT AND PLAN / ED COURSE  I reviewed the triage vital signs and the nursing notes.  Differential diagnosis including ectopic pregnancy, ovarian cyst, kidney stone, cystitis, electrolyte abnormality.  No right lower quadrant abdominal tenderness to palpation have a low suspicion for acute appendicitis.  Pregnancy test was negative however her beta quant was elevated at 70.   RADIOLOGY I independently reviewed imaging, my interpretation of imaging: TVUS -no identifiable IUP.  Read as no IUP.  No obvious heterotopic or ectopic pregnancy.  Pregnancy of unknown location.   Labs (all labs ordered are listed, but only abnormal results are displayed) Labs interpreted as -    Labs Reviewed  COMPREHENSIVE METABOLIC PANEL -  Abnormal; Notable for the following components:      Result Value   Glucose, Bld 109 (*)    All other components within normal limits  CBC - Abnormal; Notable for the following components:   Hemoglobin 15.2 (*)    All other components within normal limits  URINALYSIS, ROUTINE W REFLEX MICROSCOPIC - Abnormal; Notable for the following components:   Color, Urine YELLOW (*)    APPearance TURBID (*)    All other components within normal limits  HCG, QUANTITATIVE, PREGNANCY - Abnormal; Notable for the following components:   hCG, Beta Chain, Quant, S 70 (*)    All other components within normal limits  LIPASE, BLOOD  POC URINE PREG, ED    Offered pain medication however patient declined.  No signs of urinary tract infection.  Creatinine at baseline with no significant electrolyte abnormality.  Ultrasound with no identifiable IUP.  Patient does have a very low quant.  Discussed the patient's case with obstetrician on-call for outpatient follow-up and repeat quant/ultrasound.   Given return precautions to the emergency department for any worsening abdominal pain or vaginal bleeding.  Patient expressed understanding's.  No questions at time of discharge.  Discussed the patient's case with Dr. Lovetta, plan to follow-up on Thursday for repeat quant.  Given return precautions to the emergency department.     PROCEDURES:  Critical Care performed: No  Procedures  Patient's presentation is most consistent with acute presentation with potential threat to life or bodily function.   MEDICATIONS ORDERED IN ED: Medications - No data to display  FINAL CLINICAL IMPRESSION(S) / ED DIAGNOSES   Final diagnoses:  Lower abdominal pain  Pregnancy of unknown anatomic location     Rx / DC Orders   ED Discharge Orders     None        Note:  This document was prepared using Dragon voice recognition software and may include unintentional dictation errors.   Suzanne Kirsch, MD 06/19/23 [phone]

## 2023-07-03 ENCOUNTER — Other Ambulatory Visit: Payer: Self-pay | Admitting: Family Medicine

## 2023-07-03 DIAGNOSIS — Z3481 Encounter for supervision of other normal pregnancy, first trimester: Secondary | ICD-10-CM

## 2023-07-19 ENCOUNTER — Ambulatory Visit
Admission: RE | Admit: 2023-07-19 | Discharge: 2023-07-19 | Disposition: A | Discharge status: Home / Self Care | Payer: Medicaid Other | Source: Ambulatory Visit | Attending: Family Medicine | Admitting: Family Medicine

## 2023-07-19 DIAGNOSIS — Z3687 Encounter for antenatal screening for uncertain dates: Secondary | ICD-10-CM | POA: Diagnosis not present

## 2023-07-19 DIAGNOSIS — Z3481 Encounter for supervision of other normal pregnancy, first trimester: Secondary | ICD-10-CM | POA: Insufficient documentation

## 2023-07-19 DIAGNOSIS — O208 Other hemorrhage in early pregnancy: Secondary | ICD-10-CM | POA: Diagnosis not present

## 2023-07-19 DIAGNOSIS — Z3A10 10 weeks gestation of pregnancy: Secondary | ICD-10-CM | POA: Insufficient documentation

## 2023-12-13 ENCOUNTER — Other Ambulatory Visit: Payer: Self-pay

## 2023-12-13 ENCOUNTER — Encounter: Payer: Self-pay | Admitting: Obstetrics and Gynecology

## 2023-12-13 ENCOUNTER — Observation Stay
Admission: EM | Admit: 2023-12-13 | Discharge: 2023-12-13 | Disposition: A | Discharge status: Home / Self Care | Attending: Certified Nurse Midwife | Admitting: Obstetrics and Gynecology

## 2023-12-13 DIAGNOSIS — R109 Unspecified abdominal pain: Secondary | ICD-10-CM | POA: Diagnosis not present

## 2023-12-13 DIAGNOSIS — W19XXXA Unspecified fall, initial encounter: Principal | ICD-10-CM | POA: Diagnosis present

## 2023-12-13 DIAGNOSIS — F17210 Nicotine dependence, cigarettes, uncomplicated: Secondary | ICD-10-CM | POA: Diagnosis not present

## 2023-12-13 DIAGNOSIS — Z3A28 28 weeks gestation of pregnancy: Secondary | ICD-10-CM | POA: Diagnosis not present

## 2023-12-13 DIAGNOSIS — O99333 Smoking (tobacco) complicating pregnancy, third trimester: Secondary | ICD-10-CM | POA: Insufficient documentation

## 2023-12-13 DIAGNOSIS — O9A213 Injury, poisoning and certain other consequences of external causes complicating pregnancy, third trimester: Secondary | ICD-10-CM | POA: Diagnosis not present

## 2023-12-13 DIAGNOSIS — O26893 Other specified pregnancy related conditions, third trimester: Principal | ICD-10-CM | POA: Insufficient documentation

## 2023-12-13 DIAGNOSIS — O10013 Pre-existing essential hypertension complicating pregnancy, third trimester: Secondary | ICD-10-CM | POA: Insufficient documentation

## 2023-12-13 LAB — CBC
HCT: 31.4 % — ABNORMAL LOW (ref 36.0–46.0)
Hemoglobin: 11.2 g/dL — ABNORMAL LOW (ref 12.0–15.0)
MCH: 32.6 pg (ref 26.0–34.0)
MCHC: 35.7 g/dL (ref 30.0–36.0)
MCV: 91.3 fL (ref 80.0–100.0)
Platelets: 156 10*3/uL (ref 150–400)
RBC: 3.44 MIL/uL — ABNORMAL LOW (ref 3.87–5.11)
RDW: 12.2 % (ref 11.5–15.5)
WBC: 10.4 10*3/uL (ref 4.0–10.5)
nRBC: 0 % (ref 0.0–0.2)

## 2023-12-13 LAB — TYPE AND SCREEN
ABO/RH(D): O POS
Antibody Screen: NEGATIVE

## 2023-12-13 MED ORDER — ACETAMINOPHEN 500 MG PO TABS: 1000.0000 mg | ORAL_TABLET | Freq: Once | ORAL | Status: DC
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
## 2023-12-13 NOTE — OB Triage Note (Signed)
 Patient is a [id number] at [redacted]w[redacted]d who presents to unit after experiencing a fall at work around 0930. Patient states she fell over some boxes at work and fell on her right side and never lost consciousness. Reports also experiencing some cramping since last night. Reports +fetal movement, denies vaginal bleeding and leakage of fluid. External monitors applied and assessing. Vital signs WDL.

## 2023-12-13 NOTE — OB Triage Note (Deleted)
 na

## 2023-12-13 NOTE — OB Triage Note (Signed)
 Discharge instructions provided to pt. Pt understands labor signs and symptoms, vaginal bleeding and discharge, fetal movement reviewed by RN. Pt verbalizes understanding. Follow-up care reviewed. Pt discharged home with significant other.
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
## 2024-01-10 NOTE — Progress Notes (Signed)
 Obstetrics & Gynecology Office Visit  Subjective  Dawn Chambers is a 28 y.o. [phone] at [redacted]w[redacted]d being seen today for ongoing prenatal care.  She is currently monitored for this high-risk pregnancy.  Patient's last menstrual period was 05/08/2023. Estimated Date of Delivery: 02/29/24  Patient concerns today: Patient expresses concerns of decreased fetal movement since yesterday (07/24). She reports she has felt baby move today, but still less than normal. She states she was unable to go to L&D yesterday due to attending a funeral. She also reports concerns of cramping.   Pt denies contractions, vaginal bleeding, leaking fluid. Reports decreased fetal movement.   Objective  BP 111/72   Pulse 88   Ht 157.5 cm (5' 2)   Wt 70.8 kg (156 lb)   LMP 05/08/2023   BMI 28.53 kg/m    Pre-pregnant weight: 54.4 kg (120 lb)  TWG: 16.3 kg (36 lb)  Gen: NAD  Pulm: No use of accessory muscles, normal respirations Abdomen: Gravid, nontender Ext : No edema, no rashes.   Psych: Mood, insight, judgement intact SVE: Deferred  FHR via Doppler: 135 bpm  Assessment   28 y.o. [id number] at [redacted]w[redacted]d by  02/29/2024, by Ultrasound presenting for routine prenatal visit  Plan   Problem list reviewed and/or updated  1. Supervision of high risk pregnancy in third trimester (HHS-HCC) - Fetal kick counts and preterm labor precautions reviewed.  - Patient is unsure about delivery @ [facility]. Patient advised to transfer care prior to    35 weeks if this is an option that she desires.   2. Anxiety/ Depression complicating pregnancy, antepartum (HHS-HCC)/PTSD (post-traumatic stress disorder) - Not currently taking any medications. Per patient, she is coping well with mental    health (01/10/2024).   3. Decreased Fetal Movement - Patient informed that she has an anterior placenta so she may not feel big, strong movements. - NST unable to be completed in office (01/10/2024). NST room occupied and patient unable to stay due  to prior obligations.   - FHR: 135 bpm  - NST scheduled for 1045 on 01/11/2024.  - Patient advised to go to L&D triage for further concerns.  - Fetal kick counts reviewed with patient. Patient verbalized understanding.   4. Abdominal Cramping - Patient reassured that cramping is normal/common symptom of pregnancy at this stage.  - Patient advised that OTC Tylenol , rest, maintaining adequate hydration, applying a heating pad to area of discomfort,    emptying bladder frequently, gentle massages, and a maternal abdominal band may provide relief of symptoms.   Return in about 2 weeks (around 01/24/2024) for Routine Prenatal.   Attestation Statement:   I personally performed the service, non-incident to. (WP)   JAZMINE SIMONE LIBOON, CNM

## 2024-01-11 ENCOUNTER — Observation Stay
Admission: EM | Admit: 2024-01-11 | Discharge: 2024-01-11 | Disposition: A | Discharge status: Home / Self Care | Payer: Self-pay | Source: Ambulatory Visit | Attending: Certified Nurse Midwife | Admitting: Certified Nurse Midwife

## 2024-01-11 ENCOUNTER — Other Ambulatory Visit: Payer: Self-pay

## 2024-01-11 ENCOUNTER — Observation Stay

## 2024-01-11 ENCOUNTER — Encounter: Payer: Self-pay | Admitting: Obstetrics and Gynecology

## 2024-01-11 DIAGNOSIS — O133 Gestational [pregnancy-induced] hypertension without significant proteinuria, third trimester: Secondary | ICD-10-CM | POA: Insufficient documentation

## 2024-01-11 DIAGNOSIS — O368130 Decreased fetal movements, third trimester, not applicable or unspecified: Secondary | ICD-10-CM | POA: Diagnosis present

## 2024-01-11 DIAGNOSIS — Z3A33 33 weeks gestation of pregnancy: Secondary | ICD-10-CM | POA: Diagnosis not present

## 2024-01-11 DIAGNOSIS — O368190 Decreased fetal movements, unspecified trimester, not applicable or unspecified: Principal | ICD-10-CM | POA: Diagnosis present

## 2024-01-11 MED ORDER — CALCIUM CARBONATE ANTACID 500 MG PO CHEW: 2.0000 | CHEWABLE_TABLET | ORAL | Status: DC | PRN

## 2024-01-11 MED ORDER — ACETAMINOPHEN 325 MG PO TABS: 650.0000 mg | ORAL_TABLET | ORAL | Status: DC | PRN

## 2024-01-11 MED ORDER — LACTATED RINGERS IV SOLN: 125.0000 mL/h | INTRAVENOUS | Status: DC

## 2024-01-11 NOTE — OB Triage Note (Signed)
 Patient is a G3P2002 at [redacted]w[redacted]d who presents to unit for extended monitoring. Reports +fetal movement, occasional Darol Irving, denies vagina bleeding and leakage of fluid. External monitors applied and assessing. Initial FHT 135. Vital signs WDL.

## 2024-01-11 NOTE — Progress Notes (Signed)
 Dawn Chambers is a 74 y.n.H5E7987 at [redacted]w[redacted]d presenting for an NST for Decreased Fetal Movement.   NST performed today (01/11/2024) FHR baseline: 125-135 bpm Variability: moderate Accelerations: yes Decelerations: none Time: approximately 33 minutes Category/reactivity: reactive  RESULTS:  A NST procedure was performed with FHR monitoring and a normal baseline established, appropriate time of approximately 33 minutes of evaluation. Accels >2 were not visualized for the approximate first 24 minutes of the FHR tracing. Patient was repositioned around 1405 and >2 Accels were visualized after this OB intervention, however, patient was not monitored for at least 20 minutes after OB intervention.   Plan: - Patient was recommended to go to L&D triage for further fetal surveillance.  GLENWOOD Houston, CNM (on-call) and L&D triage notified of patient's arrival.  - Patient verbalized understanding and agreed with plan of care.   Attestation Statement:   I personally performed the service, non-incident to. [facility])   JAZMINE SIMONE Cameron, CNM  01/11/2024 1:53 PM

## 2024-01-11 NOTE — OB Triage Note (Signed)
 Discharge instructions, labor precautions, and follow-up care reviewed with patient and significant other. All questions answered. Patient verbalized understanding. Discharged ambulatory off unit.
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
## 2024-07-31 ENCOUNTER — Encounter: Admitting: Obstetrics
